# Patient Record
Sex: Male | Born: 1970 | Hispanic: Yes | Marital: Married | Smoking: Never smoker
Health system: Southern US, Community
[De-identification: ages and names within clinical notes are randomized; demographics above are authoritative.]

## PROBLEM LIST (undated history)

## (undated) DIAGNOSIS — F32A Depression, unspecified: Secondary | ICD-10-CM

## (undated) DIAGNOSIS — F329 Major depressive disorder, single episode, unspecified: Secondary | ICD-10-CM

## (undated) HISTORY — DX: Depression, unspecified: F32.A

## (undated) HISTORY — DX: Major depressive disorder, single episode, unspecified: F32.9

---

## 2013-12-05 ENCOUNTER — Ambulatory Visit (INDEPENDENT_AMBULATORY_CARE_PROVIDER_SITE_OTHER): Admitting: Family Medicine

## 2013-12-05 ENCOUNTER — Encounter: Payer: Self-pay | Admitting: Family Medicine

## 2013-12-05 VITALS — BP 126/74 | HR 72 | Temp 97.3°F | Resp 14 | Ht 74.0 in | Wt 216.0 lb

## 2013-12-05 DIAGNOSIS — F3289 Other specified depressive episodes: Secondary | ICD-10-CM

## 2013-12-05 DIAGNOSIS — F32A Depression, unspecified: Secondary | ICD-10-CM

## 2013-12-05 DIAGNOSIS — F329 Major depressive disorder, single episode, unspecified: Secondary | ICD-10-CM

## 2013-12-05 MED ORDER — VENLAFAXINE HCL ER 75 MG PO TB24: 150.0000 mg | ORAL_TABLET | Freq: Every day | ORAL | Status: DC

## 2013-12-05 NOTE — Progress Notes (Signed)
Subjective:    Patient ID: Jack Graves, male    DOB: Mar 16, 1971, 43 y.o.   MRN: [id number]  HPI Patient is a very pleasant 43 year old Hispanic male who presents today to establish care. He is also requesting a referral to psychiatrist to treat depression. He's been in the [employer] for the last 15 years and out of combat. He believes this has created a lot of anxiety in his life. He reports daily anxiety. He describes it as "a feeling of something bad is always about to happen."  He denies any panic attacks. There are no provoking situations that seem to trigger the anxiety. It is a constant state of anxiety. He just had started drinking 2 cans of beer a day to try to help control the anxiety. He also reports depression and anhedonia. He reports difficulty in sleeping because his mind is constant perseverating. He reports decreased energy. He reports poor concentration. He reports excessive feelings of guilt. He is frequently moody and has a very short temper. He is constantly snacking and his wife and children. At the prompting of his right he is seeking medical help. He denies any family history of depression, schizophrenia, bipolar disorder. The patient never had a history of depression in the past. His past medical history and past surgical history are unremarkable. His family history is also unremarkable. His mother and father are both alive and well no medical problems. He also has 2 brothers with no medical problems. Past Medical History  Diagnosis Date  . Depression    No current outpatient prescriptions on file prior to visit.   No current facility-administered medications on file prior to visit.   Allergies  Allergen Reactions  . Penicillins Rash  . Prednisone Rash   History   Social History  . Marital Status: Married    Spouse Name: N/A    Number of Children: N/A  . Years of Education: N/A   Occupational History  . Not on file.   Social History Main Topics  . Smoking  status: Never Smoker   . Smokeless tobacco: Not on file  . Alcohol Use: 0.0 oz/week    1-2 Cans of beer per week  . Drug Use: No  . Sexual Activity: Not on file     Comment: married, military x 15 years   Other Topics Concern  . Not on file   Social History Narrative  . No narrative on file   History reviewed. No pertinent family history.    Review of Systems  All other systems reviewed and are negative.       Objective:   Physical Exam  Vitals reviewed. Constitutional: He is oriented to person, place, and time. He appears well-developed and well-nourished. No distress.  HENT:  Head: Normocephalic and atraumatic.  Right Ear: External ear normal.  Left Ear: External ear normal.  Nose: Nose normal.  Mouth/Throat: Oropharynx is clear and moist. No oropharyngeal exudate.  Eyes: Conjunctivae and EOM are normal. Pupils are equal, round, and reactive to light. No scleral icterus.  Neck: Neck supple. No thyromegaly present.  Cardiovascular: Normal rate, regular rhythm and normal heart sounds.   No murmur heard. Pulmonary/Chest: Effort normal and breath sounds normal. No respiratory distress. He has no wheezes. He has no rales. He exhibits no tenderness.  Abdominal: Soft. Bowel sounds are normal. He exhibits no distension. There is no tenderness. There is no rebound.  Lymphadenopathy:    He has no cervical adenopathy.  Neurological: He is alert  and oriented to person, place, and time. He has normal reflexes. He displays normal reflexes. No cranial nerve deficit. He exhibits normal muscle tone. Coordination normal.  Skin: He is not diaphoretic.  Psychiatric: He has a normal mood and affect. His behavior is normal. Judgment and thought content normal.          Assessment & Plan:  1. Depression I will consult psychiatry. The patient like to see Dr. Donell BeersPlovsky.  In the meantime will start the patient on Effexor XR 75 mg by mouth every morning. After one week he can increase to  150 mg by mouth every morning. Recheck in 4 weeks. I asked him that he be rechecked either by  me or Dr. Rollen SoxPlovlsky.  Together we chose SNRI to try to minimize erectile dysfunction and poor libido. Another option would be Brintellix. - Venlafaxine HCl 75 MG TB24; Take 2 tablets (150 mg total) by mouth daily after breakfast.  Dispense: 30 each; Refill: 3 - Ambulatory referral to Psychiatry

## 2013-12-09 ENCOUNTER — Emergency Department (INDEPENDENT_AMBULATORY_CARE_PROVIDER_SITE_OTHER)

## 2013-12-09 ENCOUNTER — Emergency Department (INDEPENDENT_AMBULATORY_CARE_PROVIDER_SITE_OTHER)
Admission: EM | Admit: 2013-12-09 | Discharge: 2013-12-09 | Disposition: A | Discharge status: Home / Self Care | Source: Home / Self Care | Attending: Family Medicine | Admitting: Family Medicine

## 2013-12-09 ENCOUNTER — Encounter (HOSPITAL_COMMUNITY): Payer: Self-pay | Admitting: Emergency Medicine

## 2013-12-09 DIAGNOSIS — S90129A Contusion of unspecified lesser toe(s) without damage to nail, initial encounter: Secondary | ICD-10-CM

## 2013-12-09 DIAGNOSIS — S90121A Contusion of right lesser toe(s) without damage to nail, initial encounter: Secondary | ICD-10-CM

## 2013-12-09 MED ORDER — IBUPROFEN 800 MG PO TABS: 800.0000 mg | ORAL_TABLET | Freq: Three times a day (TID) | ORAL | Status: DC

## 2013-12-09 NOTE — ED Provider Notes (Signed)
CSN: [id number]     Arrival date & time 12/09/13  1220 History   First MD Initiated Contact with Patient 12/09/13 1503     Chief Complaint  Patient presents with  . Toe Pain   (Consider location/radiation/quality/duration/timing/severity/associated sxs/prior Treatment) Patient is a 43 y.o. male presenting with toe pain. The history is provided by the patient.  Toe Pain This is a new problem. The current episode started 2 days ago (45yo son stepped on toe, continues to be painful.). The problem has been gradually worsening. The symptoms are aggravated by walking.    Past Medical History  Diagnosis Date  . Depression    History reviewed. No pertinent past surgical history. History reviewed. No pertinent family history. History  Substance Use Topics  . Smoking status: Never Smoker   . Smokeless tobacco: Not on file  . Alcohol Use: 0.0 oz/week    1-2 Cans of beer per week    Review of Systems  Musculoskeletal: Positive for gait problem and joint swelling.  Skin: Negative.     Allergies  Penicillins and Prednisone  Home Medications   Current Outpatient Rx  Name  Route  Sig  Dispense  Refill  . ibuprofen (ADVIL,MOTRIN) 800 MG tablet   Oral   Take 1 tablet (800 mg total) by mouth 3 (three) times daily.   21 tablet   0   . Venlafaxine HCl 75 MG TB24   Oral   Take 2 tablets (150 mg total) by mouth daily after breakfast.   30 each   3    BP 132/85  Pulse 64  Temp(Src) 97 F (36.1 C) (Oral)  Resp 16  SpO2 100% Physical Exam  Nursing note and vitals reviewed. Constitutional: He is oriented to person, place, and time. He appears well-developed and well-nourished.  Musculoskeletal: He exhibits tenderness.       Feet:  Neurological: He is alert and oriented to person, place, and time.  Skin: Skin is warm and dry.    ED Course  Procedures (including critical care time) Labs Review Labs Reviewed - No data to display Imaging Review Dg Toe 5th Right  12/09/2013    CLINICAL DATA:  Recent traumatic injury with pain  EXAM: RIGHT FIFTH TOE  COMPARISON:  None.  FINDINGS: There is no evidence of fracture or dislocation. There is no evidence of arthropathy or other focal bone abnormality. Soft tissues are unremarkable.  IMPRESSION: No acute abnormality noted.   Electronically Signed   By: Alcide CleverMark  Lukens M.D.   On: 12/09/2013 15:24      MDM  X-rays reviewed and report per radiologist.     Linna HoffJames D Kindl, MD 12/09/13 [phone]

## 2013-12-09 NOTE — ED Notes (Addendum)
C/o right pinky toe pain due to son kept stepping on toe x2 on Wednesday States toe is swollen and red

## 2013-12-09 NOTE — Discharge Instructions (Signed)
Wear shoe and use ice and prescription as needed, activity as tolerated.

## 2013-12-10 ENCOUNTER — Emergency Department (HOSPITAL_COMMUNITY)
Admission: EM | Admit: 2013-12-10 | Discharge: 2013-12-10 | Disposition: A | Discharge status: Home / Self Care | Attending: Emergency Medicine | Admitting: Emergency Medicine

## 2013-12-10 ENCOUNTER — Encounter (HOSPITAL_COMMUNITY): Payer: Self-pay | Admitting: Emergency Medicine

## 2013-12-10 DIAGNOSIS — F329 Major depressive disorder, single episode, unspecified: Secondary | ICD-10-CM | POA: Insufficient documentation

## 2013-12-10 DIAGNOSIS — S90121A Contusion of right lesser toe(s) without damage to nail, initial encounter: Secondary | ICD-10-CM

## 2013-12-10 DIAGNOSIS — G8911 Acute pain due to trauma: Secondary | ICD-10-CM | POA: Insufficient documentation

## 2013-12-10 DIAGNOSIS — M79609 Pain in unspecified limb: Secondary | ICD-10-CM | POA: Insufficient documentation

## 2013-12-10 DIAGNOSIS — Z880 Allergy status to penicillin: Secondary | ICD-10-CM | POA: Insufficient documentation

## 2013-12-10 DIAGNOSIS — F3289 Other specified depressive episodes: Secondary | ICD-10-CM | POA: Insufficient documentation

## 2013-12-10 DIAGNOSIS — R609 Edema, unspecified: Secondary | ICD-10-CM | POA: Insufficient documentation

## 2013-12-10 DIAGNOSIS — Z79899 Other long term (current) drug therapy: Secondary | ICD-10-CM | POA: Insufficient documentation

## 2013-12-10 DIAGNOSIS — Z791 Long term (current) use of non-steroidal anti-inflammatories (NSAID): Secondary | ICD-10-CM | POA: Insufficient documentation

## 2013-12-10 MED ORDER — HYDROCODONE-ACETAMINOPHEN 5-325 MG PO TABS: 1.0000 | ORAL_TABLET | Freq: Four times a day (QID) | ORAL | Status: DC | PRN

## 2013-12-10 NOTE — Discharge Instructions (Signed)
Do not take the narcotic pain medication if you are driving as it will make you sleepy. You may continue to take the ibuprofen for inflammation. Follow up with Dr. Eulah PontMurphy if symptoms do not improve. Return here as needed.

## 2013-12-10 NOTE — ED Notes (Signed)
Hurt rt. Pinky toe this past Wednesday. Went to [facility] and had an x-ray which was normal. Been taking motrin x 3 day and pain is unbearable and still swelling.

## 2013-12-10 NOTE — ED Provider Notes (Signed)
CSN: [id number]     Arrival date & time 12/10/13  2202 History  This chart was scribed for non-physician practitioner Janne NapoleonHope M Emersyn Wyss, NP working with Darlys Galesavid Masneri, MD by Valera CastleSteven Perry, ED scribe. This patient was seen in room TR05C/TR05C and the patient's care was started at 11:14 PM.   Chief Complaint  Patient presents with  . Foot Pain   (Consider location/radiation/quality/duration/timing/severity/associated sxs/prior Treatment) The history is provided by the patient. No language interpreter was used.   HPI Comments: Jack Graves is a 43 y.o. male who presents to the Emergency Department complaining of intermittent, burning, right pinky toe pain, with associated constant swelling and bruising, onset 4 days ago when his son stepped on his toe twice. He reports having imaging done, with negative results. He reports taking Motrin for his pain, without relief. He reports trouble sleeping due to his pain. He denies having applied ice to his toe, but reports elevating his foot. He denies any other associated symptoms. He reports that he has taken so much Motrin it has caused upset stomach.   Pt arrived to ED with post-op shoe, given to him by Urgent Care a few days ago. Xray done there showed no fracture. Was given Ibuprofen. Pt continues to have pain and swelling.   PCP - Jack Graves,Jack TOM, MD  Past Medical History  Diagnosis Date  . Depression    History reviewed. No pertinent past surgical history. No family history on file. History  Substance Use Topics  . Smoking status: Never Smoker   . Smokeless tobacco: Not on file  . Alcohol Use: 0.0 oz/week    1-2 Cans of beer per week    Review of Systems  Negative except as stated in HPI  Allergies  Penicillins and Prednisone  Home Medications   Current Outpatient Rx  Name  Route  Sig  Dispense  Refill  . ibuprofen (ADVIL,MOTRIN) 800 MG tablet   Oral   Take 1 tablet (800 mg total) by mouth 3 (three) times daily.   21 tablet   0    . meloxicam (MOBIC) 15 MG tablet   Oral   Take 15 mg by mouth once.         . Venlafaxine HCl 75 MG TB24   Oral   Take 2 tablets (150 mg total) by mouth daily after breakfast.   30 each   3    BP 166/107  Pulse 71  Temp(Src) 97.8 F (36.6 C) (Oral)  Resp 18  SpO2 98%  Physical Exam  Nursing note and vitals reviewed. Constitutional: He is oriented to person, place, and time. He appears well-developed and well-nourished. No distress.  HENT:  Head: Normocephalic and atraumatic.  Eyes: EOM are normal.  Neck: Neck supple.  Cardiovascular: Normal rate and intact distal pulses.   Pulmonary/Chest: Effort normal. No respiratory distress.  Musculoskeletal: Normal range of motion. He exhibits edema and tenderness.       Right foot: He exhibits swelling. He exhibits normal capillary refill, no deformity and no laceration.       Feet:  Right 5th metatarsal swollen, ecchymotic, and ttp. Pedal pulse strong, adequate circulation, good touch sensation.  Neurological: He is alert and oriented to person, place, and time. No sensory deficit.  Skin: Skin is warm and dry.  Psychiatric: He has a normal mood and affect. His behavior is normal.    ED Course  Procedures (including critical care time)  DIAGNOSTIC STUDIES: Oxygen Saturation is 98% on room air, normal  by my interpretation.    COORDINATION OF CARE: 11:17 PM-Discussed treatment plan which includes buddy taping his toe and pain medication with pt at bedside and pt agreed to plan.   Labs Review Labs Reviewed - No data to display Imaging Review  Dg Toe 5th Right  12/09/2013   CLINICAL DATA:  Recent traumatic injury with pain  EXAM: RIGHT FIFTH TOE  COMPARISON:  None.  FINDINGS: There is no evidence of fracture or dislocation. There is no evidence of arthropathy or other focal bone abnormality. Soft tissues are unremarkable.  IMPRESSION: No acute abnormality noted.   Electronically Signed   By: Alcide Clever M.D.   On: 12/09/2013  15:24    MDM   1. Contusion of toe of right foot    43 y.o. male with contusion to the right fifth toe 3 days ago with continued swelling and tenderness. Will buddy tape, he will continue to wear the post op shoe, elevate and ice. Will add pain medication and give referral for ortho follow up. I have reviewed this patient's vital signs, nurses notes, appropriate labs and imaging.  I have discussed findings and plan of care with the patient and he voices understanding.    Medication List    TAKE these medications       HYDROcodone-acetaminophen 5-325 MG per tablet  Commonly known as:  NORCO  Take 1 tablet by mouth every 6 (six) hours as needed for moderate pain.      ASK your doctor about these medications       ibuprofen 800 MG tablet  Commonly known as:  ADVIL,MOTRIN  Take 1 tablet (800 mg total) by mouth 3 (three) times daily.     meloxicam 15 MG tablet  Commonly known as:  MOBIC  Take 15 mg by mouth once.     Venlafaxine HCl 75 MG Tb24  Take 2 tablets (150 mg total) by mouth daily after breakfast.      I personally performed the services described in this documentation, which was scribed in my presence. The recorded information has been reviewed and is accurate.         [address] [place], [place] 12/11/13 [phone]

## 2013-12-11 NOTE — ED Provider Notes (Signed)
Medical screening examination/treatment/procedure(s) were performed by non-physician practitioner and as supervising physician I was immediately available for consultation/collaboration.  EKG Interpretation   None         David Masneri, MD 12/11/13 1549 

## 2013-12-13 ENCOUNTER — Encounter: Payer: Self-pay | Admitting: Family Medicine

## 2013-12-13 ENCOUNTER — Ambulatory Visit (INDEPENDENT_AMBULATORY_CARE_PROVIDER_SITE_OTHER): Admitting: Family Medicine

## 2013-12-13 ENCOUNTER — Telehealth: Payer: Self-pay | Admitting: Family Medicine

## 2013-12-13 VITALS — BP 128/80 | HR 72 | Temp 98.8°F | Resp 18 | Ht 74.0 in | Wt 214.0 lb

## 2013-12-13 DIAGNOSIS — S90129A Contusion of unspecified lesser toe(s) without damage to nail, initial encounter: Secondary | ICD-10-CM

## 2013-12-13 DIAGNOSIS — S90121A Contusion of right lesser toe(s) without damage to nail, initial encounter: Secondary | ICD-10-CM | POA: Insufficient documentation

## 2013-12-13 NOTE — Progress Notes (Signed)
Patient ID: Jack Graves, male   DOB: 06/09/1971, 43 y.o.   MRN: [id number]   Subjective:    Patient ID: Jack Graves, male    DOB: 07/12/1971, 43 y.o.   MRN: [id number]  Patient presents for right pinky toe pain  patient presents to followup hematoma right fifth digit. Last Friday his son jumped on his foot twice he has significant pain therefore sought care at the urgent care Center x-ray of the toe was done which did not reveal a fracture. He still had quite significant pain therefore he went to the emergency room the next day he was given a walking shoe and told to followup with his primary care provider and that he had a hematoma.    Review Of Systems: per above  GEN- denies fatigue, fever, weight loss,weakness, recent illness MSK- +joint pain, muscle aches, injury       Objective:    BP 128/80  Pulse 72  Temp(Src) 98.8 F (37.1 C) (Oral)  Resp 18  Ht 6\' 2"  (1.88 m)  Wt 214 lb (97.07 kg)  BMI 27.46 kg/m2 GEN- NAD, alert and oriented x3 Skin- mild erythema and bruising of 5th digit from MIP to DIP MSK- fair ROM of 5th digit due to pain, hematoma at DIP up to nail, +swelling and small blister adjacent, no warmth, TTP, other digits, normal inspection, normal ROM Pulses- DP- 2+  Xray reviewed      Assessment & Plan:      Problem List Items Addressed This Visit   Hematoma of toe of right foot - Primary     Normal x-ray, he still has a hematoma of the fifth digit as well as a small blister I do not see any signs of superinfection. He can continue the pain medication as well as ibuprofen and icing the foot he has a walking shoe I will give him the next few days off for work as he is supposed to wear military combat boots and I do not think he can perform his duties or wear the  specific footwear at this time If it does not improve will refer to podiatry       Note: This dictation was prepared with Dragon dictation along with smaller phrase technology. Any transcriptional  errors that result from this process are unintentional.

## 2013-12-13 NOTE — Assessment & Plan Note (Signed)
Normal x-ray, he still has a hematoma of the fifth digit as well as a small blister I do not see any signs of superinfection. 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

## 2013-12-13 NOTE — Telephone Encounter (Signed)
Pt has decided that he would like a referral to the orthopedics Call back number is [phone]

## 2013-12-13 NOTE — Patient Instructions (Signed)
Call if not improved and I will refer you to orthopedics  Hematoma A hematoma is a collection of blood under the skin, in an organ, in a body space, in a joint space, or in other tissue. The blood can clot to form a lump that you can see and feel. The lump is often firm and may sometimes become sore and tender. Most hematomas get better in a few days to weeks. However, some hematomas may be serious and require medical care. Hematomas can range in size from very small to very large. CAUSES  A hematoma can be caused by a blunt or penetrating injury. It can also be caused by spontaneous leakage from a blood vessel under the skin. Spontaneous leakage from a blood vessel is more likely to occur in older people, especially those taking blood thinners. Sometimes, a hematoma can develop after certain medical procedures. SIGNS AND SYMPTOMS   A firm lump on the body.  Possible pain and tenderness in the area.  Bruising.Blue, dark blue, purple-red, or yellowish skin may appear at the site of the hematoma if the hematoma is close to the surface of the skin. For hematomas in deeper tissues or body spaces, the signs and symptoms may be subtle. For example, an intra-abdominal hematoma may cause abdominal pain, weakness, fainting, and shortness of breath. An intracranial hematoma may cause a headache or symptoms such as weakness, trouble speaking, or a change in consciousness. DIAGNOSIS  A hematoma can usually be diagnosed based on your medical history and a physical exam. Imaging tests may be needed if your health care provider suspects a hematoma in deeper tissues or body spaces, such as the abdomen, head, or chest. These tests may include ultrasonography or a CT scan.  TREATMENT  Hematomas usually go away on their own over time. Rarely does the blood need to be drained out of the body. Large hematomas or those that may affect vital organs will sometimes need surgical drainage or monitoring. HOME CARE  INSTRUCTIONS   Apply ice to the injured area:   Put ice in a plastic bag.   Place a towel between your skin and the bag.   Leave the ice on for 20 minutes, 2 3 times a day for the first 1 to 2 days.   After the first 2 days, switch to using warm compresses on the hematoma.   Elevate the injured area to help decrease pain and swelling. Wrapping the area with an elastic bandage may also be helpful. Compression helps to reduce swelling and promotes shrinking of the hematoma. Make sure the bandage is not wrapped too tight.   If your hematoma is on a lower extremity and is painful, crutches may be helpful for a couple days.   Only take over-the-counter or prescription medicines as directed by your health care provider. SEEK IMMEDIATE MEDICAL CARE IF:   You have increasing pain, or your pain is not controlled with medicine.   You have a fever.   You have worsening swelling or discoloration.   Your skin over the hematoma breaks or starts bleeding.   Your hematoma is in your chest or abdomen and you have weakness, shortness of breath, or a change in consciousness.  Your hematoma is on your scalp (caused by a fall or injury) and you have a worsening headache or a change in alertness or consciousness. MAKE SURE YOU:   Understand these instructions.  Will watch your condition.  Will get help right away if you are not doing  well or get worse. Document Released: 06/03/2004 Document Revised: 06/22/2013 Document Reviewed: 03/30/2013 [facility] Patient Information 2014 [place], [place].

## 2013-12-14 NOTE — Addendum Note (Signed)
Addended by: Milinda AntisURHAM, Kailynn Satterly F on: 12/14/2013 11:18 PM   Modules accepted: Orders

## 2013-12-14 NOTE — Telephone Encounter (Signed)
Urgent podiatry referral placed.

## 2013-12-15 ENCOUNTER — Ambulatory Visit: Admitting: Family Medicine

## 2014-05-02 ENCOUNTER — Ambulatory Visit (INDEPENDENT_AMBULATORY_CARE_PROVIDER_SITE_OTHER): Admitting: Family Medicine

## 2014-05-02 ENCOUNTER — Encounter: Payer: Self-pay | Admitting: Family Medicine

## 2014-05-02 VITALS — BP 110/70 | HR 60 | Temp 97.1°F | Resp 12 | Ht 74.0 in | Wt 216.0 lb

## 2014-05-02 DIAGNOSIS — B009 Herpesviral infection, unspecified: Secondary | ICD-10-CM

## 2014-05-02 MED ORDER — MOMETASONE FUROATE 0.1 % EX OINT
TOPICAL_OINTMENT | Freq: Every day | CUTANEOUS | Status: DC
Start: 2014-05-02 — End: 2014-08-18

## 2014-05-02 MED ORDER — VALACYCLOVIR HCL 500 MG PO TABS: 500.0000 mg | ORAL_TABLET | Freq: Two times a day (BID) | ORAL | Status: DC

## 2014-05-02 NOTE — Progress Notes (Signed)
   Subjective:    Patient ID: Jack Graves, male    DOB: 07/31/1971, 43 y.o.   MRN: [id number]  HPI  Patient has a burning erythematous papular rash just above his right clavicle. It is an erythematous patch with scattered papules in a linear grouping. Patient had a third to 3 days. He states it itches and burns. Interestingly, the patient reports that he gets the exact same rash, in the exact same spot, every year at the exact same time. He denies any recent allergic exposures. He has not [place] working [place] [employer] [place] or dealing with brush/weeds.  He denies any new creams.  The rash initially burned but now it itches. Past Medical History  Diagnosis Date  . Depression    No current outpatient prescriptions on file prior to visit.   No current facility-administered medications on file prior to visit.   Allergies  Allergen Reactions  . Penicillins Rash  . Prednisone Rash   History   Social History  . Marital Status: Married    Spouse Name: N/A    Number of Children: N/A  . Years of Education: N/A   Occupational History  . Not on file.   Social History Main Topics  . Smoking status: Never Smoker   . Smokeless tobacco: Not on file  . Alcohol Use: 0.0 oz/week    1-2 Cans of beer per week  . Drug Use: No  . Sexual Activity: Not on file     Comment: married, military x 15 years   Other Topics Concern  . Not on file   Social History Narrative  . No narrative on file     Review of Systems  All other systems reviewed and are negative.      Objective:   Physical Exam  Vitals reviewed. Cardiovascular: Normal rate, regular rhythm and normal heart sounds.   Pulmonary/Chest: Effort normal and breath sounds normal. No respiratory distress. He has no wheezes. He has no rales.  Skin: Rash noted. There is erythema.   see the description of the rash in the  history of present illness        Assessment & Plan:  1. HSV infection Given the fact he is the exact same rash in  the exact same area for several years, I believe this is a herpetiform rash. Unfortunately there no vesicles which I can culture.  I will check a serum blood test to evaluate for possible HSV exposure. Treat the patient empirically with Valtrex 500 mg by mouth twice a day for 3 days. He can use Elocon ointment as needed for itching or burning. - valACYclovir (VALTREX) 500 MG tablet; Take 1 tablet (500 mg total) by mouth 2 (two) times daily.  Dispense: 6 tablet; Refill: 0 - mometasone (ELOCON) 0.1 % ointment; Apply topically daily.  Dispense: 45 g; Refill: 0 - HSV(herpes smplx)abs-1+2(IgG+IgM)-bld

## 2014-05-03 LAB — HSV(HERPES SMPLX)ABS-I+II(IGG+IGM)-BLD
HERPES SIMPLEX VRS I-IGM AB (EIA): 0.54 {index}
HSV 1 Glycoprotein G Ab, IgG: 1.8 IV — ABNORMAL HIGH
HSV 2 GLYCOPROTEIN G AB, IGG: 13.57 IV — AB

## 2014-08-12 ENCOUNTER — Emergency Department: Payer: Self-pay | Admitting: Emergency Medicine

## 2014-08-13 ENCOUNTER — Emergency Department: Payer: Self-pay | Admitting: Emergency Medicine

## 2014-08-14 ENCOUNTER — Telehealth: Payer: Self-pay | Admitting: Family Medicine

## 2014-08-14 DIAGNOSIS — S161XXA Strain of muscle, fascia and tendon at neck level, initial encounter: Secondary | ICD-10-CM

## 2014-08-14 NOTE — Telephone Encounter (Signed)
Pt wants a referral to see [facility], he called ortho office to schedule appt and needs a [employer] referral from us PCP, I called [facility] to see if I can get Medical Records for when pt was seen, was informed need to fax over to [phone] to receive Medical record,I [facility] so that I can get information has to why needing to go have ortho done.

## 2014-08-14 NOTE — Telephone Encounter (Signed)
Patient is calling saying that he already has appointment with an ortho doc but needs a referral for his [employer] insurance if possible  Please call him back at [phone]

## 2014-08-15 NOTE — Telephone Encounter (Signed)
Referral placed for orthopedics to Dr. Anderson MaltaBrooks [facility], submitted [employer] referral, pending approval

## 2014-08-17 ENCOUNTER — Encounter (HOSPITAL_COMMUNITY): Payer: Self-pay | Admitting: Emergency Medicine

## 2014-08-17 ENCOUNTER — Emergency Department: Payer: Self-pay | Admitting: Emergency Medicine

## 2014-08-17 DIAGNOSIS — Z8659 Personal history of other mental and behavioral disorders: Secondary | ICD-10-CM | POA: Insufficient documentation

## 2014-08-17 DIAGNOSIS — Z79899 Other long term (current) drug therapy: Secondary | ICD-10-CM | POA: Insufficient documentation

## 2014-08-17 DIAGNOSIS — Z880 Allergy status to penicillin: Secondary | ICD-10-CM | POA: Insufficient documentation

## 2014-08-17 DIAGNOSIS — M542 Cervicalgia: Secondary | ICD-10-CM | POA: Diagnosis not present

## 2014-08-17 DIAGNOSIS — Z7952 Long term (current) use of systemic steroids: Secondary | ICD-10-CM | POA: Diagnosis not present

## 2014-08-17 NOTE — ED Notes (Signed)
Pt. reports persistent right side neck pain onset Friday last week , seen at [facility] twice received pain injections /CT scan done and referral to orthopedic MD , seen by Ortho MD today advised to follow up with PT , no relief from prescription pain medications . Denies fever or neck stiffness

## 2014-08-18 ENCOUNTER — Emergency Department (HOSPITAL_COMMUNITY)
Admission: EM | Admit: 2014-08-18 | Discharge: 2014-08-18 | Disposition: A | Discharge status: Home / Self Care | Attending: Emergency Medicine | Admitting: Emergency Medicine

## 2014-08-18 ENCOUNTER — Ambulatory Visit (INDEPENDENT_AMBULATORY_CARE_PROVIDER_SITE_OTHER): Admitting: Family Medicine

## 2014-08-18 ENCOUNTER — Encounter: Payer: Self-pay | Admitting: Family Medicine

## 2014-08-18 VITALS — BP 126/74 | HR 64 | Temp 98.4°F | Resp 14 | Ht 74.0 in | Wt 216.0 lb

## 2014-08-18 DIAGNOSIS — B085 Enteroviral vesicular pharyngitis: Secondary | ICD-10-CM

## 2014-08-18 DIAGNOSIS — M542 Cervicalgia: Secondary | ICD-10-CM

## 2014-08-18 MED ORDER — VALACYCLOVIR HCL 1 G PO TABS: 2000.0000 mg | ORAL_TABLET | Freq: Two times a day (BID) | ORAL | Status: DC

## 2014-08-18 MED ORDER — METHOCARBAMOL 500 MG PO TABS: 500.0000 mg | ORAL_TABLET | Freq: Two times a day (BID) | ORAL | Status: DC

## 2014-08-18 MED ORDER — KETOROLAC TROMETHAMINE 30 MG/ML IJ SOLN
30.0000 mg | Freq: Once | INTRAMUSCULAR | Status: AC
  Administered 2014-08-18: 30 mg via INTRAMUSCULAR
  Filled 2014-08-18: qty 1

## 2014-08-18 MED ORDER — TRAMADOL HCL 50 MG PO TABS: 50.0000 mg | ORAL_TABLET | Freq: Four times a day (QID) | ORAL | Status: DC | PRN

## 2014-08-18 NOTE — ED Provider Notes (Signed)
CSN: [id number]     Arrival date & time 08/17/14  2344 History   First MD Initiated Contact with Patient 08/18/14 0028     Chief Complaint  Patient presents with  . Neck Pain     (Consider location/radiation/quality/duration/timing/severity/associated sxs/prior Treatment) HPI Comments: Patient presents today with pain of the cervical spine.  Pain has been present for the past six days and is gradually worsening.  He denies acute injury or trauma.  He reports that he was seen in the ED at [facility] five days ago and had a CT scan of the cervical spine.  He brings in the copy of the CT report.  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  It also shows moderate left C3-4 and moderate to severe right C6-7 neural foraminal narrowing.  He reports that he was then referred to Dr. Shon BatonBrooks.  He saw Dr. Shon BatonBrooks in the office today.  He reports that Dr. Shon BatonBrooks then referred him to Dr. Ethelene Halamos to receive injections and also was referred to PT.  He denies fever, chills, new numbness or tingling, or weakness.  He has been taking Robaxin, Norco, and Ketorolac for the pain, which has helped.  However, he is almost out of the medication.    The history is provided by the patient.    Past Medical History  Diagnosis Date  . Depression    History reviewed. No pertinent past surgical history. No family history on file. History  Substance Use Topics  . Smoking status: Never Smoker   . Smokeless tobacco: Not on file  . Alcohol Use: 0.0 oz/week    1-2 Cans of beer per week    Review of Systems  Musculoskeletal: Positive for neck pain.  All other systems reviewed and are negative.     Allergies  Penicillins and Prednisone  Home Medications   Prior to Admission medications   Medication Sig Start Date End Date Taking? Authorizing Provider  mometasone (ELOCON) 0.1 % ointment Apply topically daily. 05/02/14   Donita BrooksWarren T Pickard, MD  valACYclovir (VALTREX) 500 MG tablet Take 1 tablet (500 mg total) by  mouth 2 (two) times daily. 05/02/14   Donita BrooksWarren T Pickard, MD   BP 148/87  Pulse 67  Temp(Src) 98.1 F (36.7 C)  Resp 18  Wt 216 lb 6 oz (98.147 kg)  SpO2 100% Physical Exam  Nursing note and vitals reviewed. Constitutional: He appears well-developed and well-nourished.  HENT:  Head: Normocephalic and atraumatic.  Cardiovascular: Normal rate, regular rhythm and normal heart sounds.   Pulmonary/Chest: Effort normal and breath sounds normal.  Musculoskeletal: Normal range of motion.       Cervical back: He exhibits tenderness and bony tenderness. He exhibits normal range of motion, no swelling, no edema and no deformity.       Thoracic back: He exhibits normal range of motion, no tenderness, no bony tenderness, no swelling, no edema and no deformity.       Lumbar back: He exhibits normal range of motion, no tenderness, no bony tenderness, no swelling, no edema and no deformity.  Full ROM of UE bilaterally  Neurological: He is alert. No sensory deficit.  Distal sensation of both hands intact Grip strength 5/5 bilaterally  Skin: Skin is warm and dry.  Psychiatric: He has a normal mood and affect.    ED Course  Procedures (including critical care time) Labs Review Labs Reviewed - No data to display  Imaging Review No results found.   EKG Interpretation None  MDM   Final diagnoses:  None   Patient presenting with neck pain x 6 days, which is gradually worsening.  No neurological deficits.  Patient had recent imaging of the cervical spine showing DDD and also neural foraminal narrowing.  Patient is afebrile.  He was seen by Dr. Shon BatonBrooks today and has been referred to Dr. Ethelene Halamos to receive injections.  Patient given IM Toradol in the ED.  Patient stable for discharge.  Return precautions given.      Santiago GladHeather Meila Berke, PA-C 08/18/14 [phone]

## 2014-08-18 NOTE — Progress Notes (Signed)
Subjective:    Patient ID: Jack Graves, male    DOB: 07/10/1971, 43 y.o.   MRN: [id number]  HPI  Last week, the patient developed severe right-sided neck pain. He was seen multiple times at the emergency room where CT scan of the neck was negative. He is also seeing a back surgeon who performed x-rays which were negative. He was told that he has a muscle strain in his neck. However over the last 2-3 days, he has developed numerous vesicular ulcers in his mouth. There are 2 yellow-based ulcers on his tongue which are approximately 5-6 mm in diameter. He has 2 similar ulcers on his hard palate and one in the right posterior oropharynx which I believe accounts for his right ear pain. On examination of his right unit, the tympanic membrane appears completely normal. The patient has tender lymphadenopathy in the right anterior cervical chain. He is also complaining of neck stiffness and headache. He has no evidence of encephalitis. He has range of motion without pain in his neck today and there is no evidence of meningitis in the patient has no rash on his palms or on his feet. He has no known exposure to hand foot and mouth disease. Past Medical History  Diagnosis Date  . Depression    No past surgical history on file. Current Outpatient Prescriptions on File Prior to Visit  Medication Sig Dispense Refill  . traMADol (ULTRAM) 50 MG tablet Take 1 tablet (50 mg total) by mouth every 6 (six) hours as needed.  15 tablet  0  . valACYclovir (VALTREX) 500 MG tablet Take 1 tablet (500 mg total) by mouth 2 (two) times daily.  6 tablet  0   No current facility-administered medications on file prior to visit.   Allergies  Allergen Reactions  . Penicillins Rash  . Prednisone Rash   History   Social History  . Marital Status: Married    Spouse Name: N/A    Number of Children: N/A  . Years of Education: N/A   Occupational History  . Not on file.   Social History Main Topics  . Smoking  status: Never Smoker   . Smokeless tobacco: Not on file  . Alcohol Use: 0.0 oz/week    1-2 Cans of beer per week  . Drug Use: No  . Sexual Activity: Not on file     Comment: married, military x 15 years   Other Topics Concern  . Not on file   Social History Narrative  . No narrative on file     Review of Systems  All other systems reviewed and are negative.      Objective:   Physical Exam  Vitals reviewed. Constitutional: He is oriented to person, place, and time. He appears well-developed and well-nourished.  HENT:  Right Ear: Tympanic membrane and ear canal normal.  Left Ear: Tympanic membrane and ear canal normal.  Nose: Nose normal.  Mouth/Throat: Oral lesions present. Posterior oropharyngeal erythema present. No oropharyngeal exudate or tonsillar abscesses.  Cardiovascular: Normal rate and regular rhythm.   Pulmonary/Chest: Effort normal and breath sounds normal.  Lymphadenopathy:    He has cervical adenopathy.  Neurological: He is alert and oriented to person, place, and time. He has normal reflexes. He displays normal reflexes. No cranial nerve deficit. He exhibits normal muscle tone. Coordination normal.          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 This explains his right ear pain. Also explains the pain in his mouth. It may even account for the muscle soreness that he is experiencing on the right side of his neck.  At the present time there is no evidence of meningitis or encephalitis. Patient states he gets similar outbreaks of these aphthous ulcers in his mouth quite frequently however this is the most severe outbreak he has ever had. I will treat the patient with about 6 to milligrams by mouth twice a day for one day. I also gave the patient a prescription for [facility] magic mouthwash 1 teaspoon every 6 hours as  needed for a sore throat.  Recheck Monday or sooner if worse.

## 2014-08-22 NOTE — ED Provider Notes (Signed)
Medical screening examination/treatment/procedure(s) were performed by non-physician practitioner and as supervising physician I was immediately available for consultation/collaboration.   EKG Interpretation None      Mahmoud Blazejewski, MD, FACEP   Brizeida Mcmurry L Omarrion Carmer, MD 08/22/14 1607 

## 2014-08-24 ENCOUNTER — Inpatient Hospital Stay (HOSPITAL_COMMUNITY)
Admission: EM | Admit: 2014-08-24 | Discharge: 2014-08-29 | DRG: 092 | Disposition: A | Discharge status: Home / Self Care | Attending: Internal Medicine | Admitting: Internal Medicine

## 2014-08-24 ENCOUNTER — Encounter: Payer: Self-pay | Admitting: Family Medicine

## 2014-08-24 ENCOUNTER — Emergency Department (HOSPITAL_COMMUNITY)

## 2014-08-24 ENCOUNTER — Ambulatory Visit (INDEPENDENT_AMBULATORY_CARE_PROVIDER_SITE_OTHER): Admitting: Family Medicine

## 2014-08-24 ENCOUNTER — Encounter (HOSPITAL_COMMUNITY): Payer: Self-pay | Admitting: Emergency Medicine

## 2014-08-24 VITALS — BP 110/74 | HR 74 | Temp 98.5°F | Resp 14 | Ht 74.0 in | Wt 215.0 lb

## 2014-08-24 DIAGNOSIS — Z79899 Other long term (current) drug therapy: Secondary | ICD-10-CM | POA: Diagnosis not present

## 2014-08-24 DIAGNOSIS — R21 Rash and other nonspecific skin eruption: Secondary | ICD-10-CM | POA: Diagnosis present

## 2014-08-24 DIAGNOSIS — F329 Major depressive disorder, single episode, unspecified: Secondary | ICD-10-CM | POA: Diagnosis present

## 2014-08-24 DIAGNOSIS — R51 Headache: Secondary | ICD-10-CM

## 2014-08-24 DIAGNOSIS — Z880 Allergy status to penicillin: Secondary | ICD-10-CM | POA: Diagnosis not present

## 2014-08-24 DIAGNOSIS — M542 Cervicalgia: Secondary | ICD-10-CM

## 2014-08-24 DIAGNOSIS — Z8619 Personal history of other infectious and parasitic diseases: Secondary | ICD-10-CM | POA: Diagnosis not present

## 2014-08-24 DIAGNOSIS — G44001 Cluster headache syndrome, unspecified, intractable: Secondary | ICD-10-CM

## 2014-08-24 DIAGNOSIS — H70001 Acute mastoiditis without complications, right ear: Secondary | ICD-10-CM | POA: Diagnosis present

## 2014-08-24 DIAGNOSIS — M436 Torticollis: Secondary | ICD-10-CM

## 2014-08-24 DIAGNOSIS — G08 Intracranial and intraspinal phlebitis and thrombophlebitis: Principal | ICD-10-CM

## 2014-08-24 DIAGNOSIS — R519 Headache, unspecified: Secondary | ICD-10-CM | POA: Diagnosis present

## 2014-08-24 DIAGNOSIS — I809 Phlebitis and thrombophlebitis of unspecified site: Secondary | ICD-10-CM | POA: Diagnosis present

## 2014-08-24 DIAGNOSIS — R291 Meningismus: Secondary | ICD-10-CM

## 2014-08-24 DIAGNOSIS — G4459 Other complicated headache syndrome: Secondary | ICD-10-CM

## 2014-08-24 DIAGNOSIS — R509 Fever, unspecified: Secondary | ICD-10-CM

## 2014-08-24 DIAGNOSIS — H70009 Acute mastoiditis without complications, unspecified ear: Secondary | ICD-10-CM | POA: Diagnosis present

## 2014-08-24 LAB — COMPREHENSIVE METABOLIC PANEL
ALBUMIN: 3.5 g/dL (ref 3.5–5.2)
ALK PHOS: 62 U/L (ref 39–117)
ALT: 18 U/L (ref 0–53)
AST: 15 U/L (ref 0–37)
Anion gap: 14 (ref 5–15)
BILIRUBIN TOTAL: 0.9 mg/dL (ref 0.3–1.2)
BUN: 12 mg/dL (ref 6–23)
CHLORIDE: 100 meq/L (ref 96–112)
CO2: 26 meq/L (ref 19–32)
Calcium: 9.1 mg/dL (ref 8.4–10.5)
Creatinine, Ser: 1.16 mg/dL (ref 0.50–1.35)
GFR calc Af Amer: 86 mL/min — ABNORMAL LOW (ref >90)
GFR, EST NON AFRICAN AMERICAN: 75 mL/min — AB (ref >90)
Glucose, Bld: 104 mg/dL — ABNORMAL HIGH (ref 70–99)
POTASSIUM: 3.5 meq/L — AB (ref 3.7–5.3)
SODIUM: 140 meq/L (ref 137–147)
Total Protein: 7.7 g/dL (ref 6.0–8.3)

## 2014-08-24 LAB — CBC WITH DIFFERENTIAL/PLATELET
BASOS ABS: 0 10*3/uL (ref 0.0–0.1)
BASOS PCT: 0 % (ref 0–1)
Eosinophils Absolute: 0.2 10*3/uL (ref 0.0–0.7)
Eosinophils Relative: 2 % (ref 0–5)
HEMATOCRIT: 41.9 % (ref 39.0–52.0)
Hemoglobin: 15.1 g/dL (ref 13.0–17.0)
LYMPHS PCT: 16 % (ref 12–46)
Lymphs Abs: 1.8 10*3/uL (ref 0.7–4.0)
MCH: 31 pg (ref 26.0–34.0)
MCHC: 36 g/dL (ref 30.0–36.0)
MCV: 86 fL (ref 78.0–100.0)
MONO ABS: 1 10*3/uL (ref 0.1–1.0)
Monocytes Relative: 9 % (ref 3–12)
NEUTROS ABS: 8.3 10*3/uL — AB (ref 1.7–7.7)
NEUTROS PCT: 73 % (ref 43–77)
PLATELETS: 325 10*3/uL (ref 150–400)
RBC: 4.87 MIL/uL (ref 4.22–5.81)
RDW: 11.8 % (ref 11.5–15.5)
WBC: 11.3 10*3/uL — AB (ref 4.0–10.5)

## 2014-08-24 LAB — LACTIC ACID, PLASMA: Lactic Acid, Venous: 1 mmol/L (ref 0.5–2.2)

## 2014-08-24 LAB — PROTIME-INR
INR: 1.21 (ref 0.00–1.49)
PROTHROMBIN TIME: 15.4 s — AB (ref 11.6–15.2)

## 2014-08-24 LAB — I-STAT CG4 LACTIC ACID, ED: Lactic Acid, Venous: 1 mmol/L (ref 0.5–2.2)

## 2014-08-24 MED ORDER — DEXTROSE 5 % IV SOLN
10.0000 mg/kg | Freq: Once | INTRAVENOUS | Status: AC
  Administered 2014-08-25: 820 mg via INTRAVENOUS
  Filled 2014-08-24: qty 16.4

## 2014-08-24 MED ORDER — DEXTROSE 5 % IV SOLN
2.0000 g | Freq: Once | INTRAVENOUS | Status: AC
  Administered 2014-08-25: 2 g via INTRAVENOUS
  Filled 2014-08-24: qty 2

## 2014-08-24 MED ORDER — SODIUM CHLORIDE 0.9 % IV BOLUS (SEPSIS)
1000.0000 mL | Freq: Once | INTRAVENOUS | Status: AC
  Administered 2014-08-24: 1000 mL via INTRAVENOUS

## 2014-08-24 MED ORDER — VANCOMYCIN HCL 10 G IV SOLR
2000.0000 mg | Freq: Once | INTRAVENOUS | Status: AC
  Administered 2014-08-25: 2000 mg via INTRAVENOUS
  Filled 2014-08-24: qty 2000

## 2014-08-24 MED ORDER — LIDOCAINE HCL (PF) 1 % IJ SOLN
INTRAMUSCULAR | Status: AC
  Filled 2014-08-24: qty 10

## 2014-08-24 MED ORDER — DIAZEPAM 10 MG PO TABS: 10.0000 mg | ORAL_TABLET | Freq: Three times a day (TID) | ORAL | Status: DC | PRN

## 2014-08-24 NOTE — ED Notes (Signed)
Patient presents moaning out loud with pain in his headl  Has been taking Valium for his neck

## 2014-08-24 NOTE — ED Provider Notes (Signed)
CSN: [id number]     Arrival date & time 08/24/14  2034 History   First MD Initiated Contact with Patient 08/24/14 2108     Chief Complaint  Patient presents with  . Headache    Patient is a 43 y.o. male presenting with headaches. The history is provided by the patient.  Headache Pain location:  Frontal Quality:  Stabbing Radiates to:  R neck Severity currently:  10/10 Severity at highest:  10/10 Onset quality:  Sudden Duration:  2 weeks Timing:  Constant Progression:  Unchanged Chronicity:  New Similar to prior headaches: no   Context comment:  Recent dental procedure; hx of HSV Relieved by:  Nothing Worsened by:  Neck movement Ineffective treatments:  NSAIDs, acetaminophen and prescription medications (Valium, Oxycodone) Associated symptoms: ear pain (right) and fever   Associated symptoms: no cough, no diarrhea, no nausea, no numbness, no photophobia, no URI, no visual change, no vomiting and no weakness   Ear pain:    Location:  Right   Severity:  Severe   Onset quality:  Sudden   Duration:  2 weeks   Timing:  Constant   Progression:  Unchanged   Chronicity:  New Fever:    Duration:  1 day   Timing:  Constant   Temp source:  Subjective Risk factors: lifestyle not sedentary     Past Medical History  Diagnosis Date  . Depression    History reviewed. No pertinent past surgical history. No family history on file. History  Substance Use Topics  . Smoking status: Never Smoker   . Smokeless tobacco: Not on file  . Alcohol Use: 0.0 oz/week    1-2 Cans of beer per week    Review of Systems  Constitutional: Positive for fever, chills and appetite change (decreased).  HENT: Positive for ear pain (right).        Oral lesions  Eyes: Negative for photophobia and visual disturbance.  Respiratory: Negative for cough and shortness of breath.   Cardiovascular: Negative for chest pain.  Gastrointestinal: Negative for nausea, vomiting and diarrhea.  Skin: Positive for  rash.  Neurological: Positive for headaches. Negative for speech difficulty, weakness and numbness.  All other systems reviewed and are negative.     Allergies  Penicillins and Prednisone  Home Medications   Prior to Admission medications   Medication Sig Start Date End Date Taking? Authorizing Provider  diazepam (VALIUM) 10 MG tablet Take 1 tablet (10 mg total) by mouth every 8 (eight) hours as needed (muscle pain). 08/24/14   Donita BrooksWarren T Pickard, MD  methocarbamol (ROBAXIN) 500 MG tablet Take 500 mg by mouth 2 (two) times daily.    Historical Provider, MD  traMADol (ULTRAM) 50 MG tablet Take 1 tablet (50 mg total) by mouth every 6 (six) hours as needed. 08/18/14   Heather Laisure, PA-C  valACYclovir (VALTREX) 1000 MG tablet Take 2 tablets (2,000 mg total) by mouth 2 (two) times daily. 08/18/14   Donita BrooksWarren T Pickard, MD  valACYclovir (VALTREX) 500 MG tablet Take 1 tablet (500 mg total) by mouth 2 (two) times daily. 05/02/14   Donita BrooksWarren T Pickard, MD   BP 136/82  Pulse 99  Temp(Src) 100.6 F (38.1 C) (Oral)  Resp 20  Ht 6\' 2"  (1.88 m)  Wt 210 lb (95.255 kg)  BMI 26.95 kg/m2  SpO2 100%  Physical Exam  Constitutional: He is oriented to person, place, and time. He appears well-developed and well-nourished. He appears distressed (appears uncomfortable).  HENT:  Head: Normocephalic and  atraumatic.  Right Ear: Tympanic membrane and external ear normal.  Left Ear: Tympanic membrane and external ear normal.  No sublingual or submandibular fullness or tenderness; ulcerated lesions on tongue  Eyes: Pupils are equal, round, and reactive to light.  Neck:  Neck stiffness present; will not turn head right/left even with distraction  Cardiovascular: Regular rhythm.  Tachycardia present.   Pulmonary/Chest: Effort normal and breath sounds normal. No respiratory distress. He has no decreased breath sounds. He has no wheezes.  Abdominal: Soft. Normal appearance. He exhibits no distension. There is no  tenderness.  Neurological: He is alert and oriented to person, place, and time. He displays no tremor. No sensory deficit. Coordination normal. GCS eye subscore is 4. GCS verbal subscore is 5. GCS motor subscore is 6.  Normal strength and sensation in all extremities; CN 3-12 intact; Normal finger to nose bilaterally  Skin: Skin is warm. Rash (pustules over torso, face, legs) noted. Rash is pustular (diffuse). He is diaphoretic.  Psychiatric: His mood appears anxious.    ED Course  Procedures (including critical care time)  Labs Review  Results for orders placed during the hospital encounter of 08/24/14  CBC WITH DIFFERENTIAL      Result Value Ref Range   WBC 11.3 (*) 4.0 - 10.5 K/uL   RBC 4.87  4.22 - 5.81 MIL/uL   Hemoglobin 15.1  13.0 - 17.0 g/dL   HCT 14.741.9  82.939.0 - 56.252.0 %   MCV 86.0  78.0 - 100.0 fL   MCH 31.0  26.0 - 34.0 pg   MCHC 36.0  30.0 - 36.0 g/dL   RDW 13.011.8  86.511.5 - 78.415.5 %   Platelets 325  150 - 400 K/uL   Neutrophils Relative % 73  43 - 77 %   Neutro Abs 8.3 (*) 1.7 - 7.7 K/uL   Lymphocytes Relative 16  12 - 46 %   Lymphs Abs 1.8  0.7 - 4.0 K/uL   Monocytes Relative 9  3 - 12 %   Monocytes Absolute 1.0  0.1 - 1.0 K/uL   Eosinophils Relative 2  0 - 5 %   Eosinophils Absolute 0.2  0.0 - 0.7 K/uL   Basophils Relative 0  0 - 1 %   Basophils Absolute 0.0  0.0 - 0.1 K/uL  COMPREHENSIVE METABOLIC PANEL      Result Value Ref Range   Sodium 140  137 - 147 mEq/L   Potassium 3.5 (*) 3.7 - 5.3 mEq/L   Chloride 100  96 - 112 mEq/L   CO2 26  19 - 32 mEq/L   Glucose, Bld 104 (*) 70 - 99 mg/dL   BUN 12  6 - 23 mg/dL   Creatinine, Ser 6.961.16  0.50 - 1.35 mg/dL   Calcium 9.1  8.4 - 29.510.5 mg/dL   Total Protein 7.7  6.0 - 8.3 g/dL   Albumin 3.5  3.5 - 5.2 g/dL   AST 15  0 - 37 U/L   ALT 18  0 - 53 U/L   Alkaline Phosphatase 62  39 - 117 U/L   Total Bilirubin 0.9  0.3 - 1.2 mg/dL   GFR calc non Af Amer 75 (*) >90 mL/min   GFR calc Af Amer 86 (*) >90 mL/min   Anion gap 14  5 -  15  LACTIC ACID, PLASMA      Result Value Ref Range   Lactic Acid, Venous 1.0  0.5 - 2.2 mmol/L  PROTIME-INR  Result Value Ref Range   Prothrombin Time 15.4 (*) 11.6 - 15.2 seconds   INR 1.21  0.00 - 1.49  I-STAT CG4 LACTIC ACID, ED      Result Value Ref Range   Lactic Acid, Venous 1.00  0.5 - 2.2 mmol/L    Imaging Review Ct Head Wo Contrast  08/24/2014   CLINICAL DATA:  Severe right side headache  EXAM: CT HEAD WITHOUT CONTRAST  TECHNIQUE: Contiguous axial images were obtained from the base of the skull through the vertex without intravenous contrast.  COMPARISON:  None  FINDINGS: No skull fracture is noted. Paranasal sinuses and mastoid air cells are unremarkable. No intracranial hemorrhage, mass effect or midline shift. No acute cortical infarction. No mass lesion is noted on this unenhanced scan. The gray and white-matter differentiation is preserved.  IMPRESSION: No acute intracranial abnormality.   Electronically Signed   By: Natasha Mead M.D.   On: 08/24/2014 22:58   Dg Chest Port 1 View  08/24/2014   CLINICAL DATA:  43 year old male with acute fever, right side head and neck pain. Initial encounter.  EXAM: PORTABLE CHEST - 1 VIEW  COMPARISON:  None.  FINDINGS: Portable AP semi upright view at at 2159 hrs. Mildly low lung volumes. Normal cardiac size and mediastinal contours. No pneumothorax, pulmonary edema, pleural effusion or confluent pulmonary opacity. No osseous abnormality identified.  IMPRESSION: Somewhat low lung volumes, otherwise no acute cardiopulmonary abnormality.   Electronically Signed   By: Augusto Gamble M.D.   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 Presents with fever, diffuse rash, severe right head pain, right neck pain, tongue ulcers. Has been taking a large amount of ibuprofen (80 tablets in 2 days about 4 days ago). Started on Valium today for right sided neck pain.  Remainder of HPI, ROS, and Exam as above.   Concern for infectious process - specifically concerned for meningitis. Hx of HSV, concern for disseminated HSV infection given his pustular diffuse rash, fever, headache. CXR and urine studies also ordered.   CT Head with no acute process. LP done, see ED attending procedure note. Fluid appeared clear.  Given acyclovir, rocephin and vancomycin. Given IV fluids. Given IV pain medication and PO tylenol for fever.   Labs obtained. Mild leukocytosis, CMP notable for mildly decreased GFR. Normal Creatinine. INR wnl. Lactic acid wnl. CXR showed no acute process.   Urine ordered, urine culture ordered. CSF studies ordered.   Admitted to medicine for presumed meningitis - viral vs bacterial pending at the time of admission.  CSF studies pending at the time of admission and neurology consultation. Covered empirically with antibiotics and acyclovir. Neurology consulted due to presumed meningitis.  This case managed and discussed with my attending physician, Dr. Eber Hong.     Maxine Glenn, MD 08/25/14 [phone]

## 2014-08-24 NOTE — ED Provider Notes (Signed)
The patient is a 43 year old male, he has recently developed a headache over the last 2 weeks which is right-sided and associated with stiff neck and over the last couple of days he has developed a fever and a diffuse pustular rash to his entire body including the face, bilateral arms and bilateral legs and trunk. He has multiple ulcers across his tongue, he has seen multiple physicians prior to this evaluation.  On exam the patient does have a stiff neck, he has some reproducible tenderness over the right side of his scalp, right neck, normal lung sounds, soft abdomen, no peripheral edema.    Labs, CT, LP, likely admit.    I saw and evaluated the patient, reviewed the resident's note and I agree with the findings and plan.   Final diagnoses:  Fever  Headache    Physical Exam  BP 136/82  Pulse 99  Temp(Src) 102.2 F (39 C) (Oral)  Resp 20  Ht 6\' 2"  (1.88 m)  Wt 210 lb (95.255 kg)  BMI 26.95 kg/m2  SpO2 100%  Physical Exam  ED Course  LUMBAR PUNCTURE Date/Time: 08/24/2014 11:30 PM Performed by: Eber HongMILLER, Campbell Kray D Authorized by: Eber HongMILLER, Allessandra Bernardi D Consent: Verbal consent obtained. written consent obtained. Risks and benefits: risks, benefits and alternatives were discussed Consent given by: patient Patient understanding: patient states understanding of the procedure being performed Patient consent: the patient's understanding of the procedure matches consent given Required items: required blood products, implants, devices, and special equipment available Patient identity confirmed: verbally with patient Time out: Immediately prior to procedure a "time out" was called to verify the correct patient, procedure, equipment, support staff and site/side marked as required. Indications: evaluation for infection Anesthesia: local infiltration Local anesthetic: lidocaine 1% with epinephrine Anesthetic total: 5 ml Patient sedated: no Preparation: Patient was prepped and draped in the usual  sterile fashion. Lumbar space: L3-L4 interspace Patient's position: sitting Needle gauge: 20 Needle type: spinal needle - Quincke tip Needle length: 3.5 in Number of attempts: 1 Fluid appearance: clear Tubes of fluid: 4 Total volume: 4 ml Post-procedure: site cleaned and adhesive bandage applied Patient tolerance: Patient tolerated the procedure well with no immediate complications.         Vida RollerBrian D Deion Swift, MD 08/25/14 [phone]

## 2014-08-24 NOTE — ED Notes (Signed)
Pt c/o r sided frontal headache x 2 weeks with four ED visits; reports taking percocet, valium, muscle relaxer, and ibuprofen without relief. Reports fever at home. Red bumps present on tongue. Reports seeing PCP who "gave me some antibiotics" and the bumps are better. Headache described as stabbing pain; has seen a "neck specialist" for neck pain. Pt presents with soft collar for pain in neck. Multiple red bumps noted on bilateral legs and face. Pt reports taking "the whole bottle of ibuprofen in 2 days; there were 80 in the bottle." MD at bedside at this time

## 2014-08-24 NOTE — ED Notes (Signed)
MD at bedside completing LP. 

## 2014-08-24 NOTE — Progress Notes (Signed)
Subjective:    Patient ID: Jack Graves, male    DOB: 01/30/1971, 43 y.o.   MRN: [id number]  HPI  08/18/14 Last week, the patient developed severe right-sided neck pain. He was seen multiple times at the emergency room where CT scan of the neck was negative. He is also seeing a back surgeon who performed x-rays which were negative. He was told that he has a muscle strain in his neck. However over the last 2-3 days, he has developed numerous vesicular ulcers in his mouth. There are 2 yellow-based ulcers on his tongue which are approximately 5-6 mm in diameter. He has 2 similar ulcers on his hard palate and one in the right posterior oropharynx which I believe accounts for his right ear pain. On examination of his right unit, the tympanic membrane appears completely normal. The patient has tender lymphadenopathy in the right anterior cervical chain. He is also complaining of neck stiffness and headache. He has no evidence of encephalitis. He has range of motion without pain in his neck today and there is no evidence of meningitis in the patient has no rash on his palms or on his feet. He has no known exposure to hand foot and mouth disease.  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 This explains his right ear pain. Also explains the pain in his mouth. It may even account for the muscle soreness that he is experiencing on the right side of his neck.  At the present time there is no evidence of meningitis or encephalitis. Patient states he gets similar outbreaks of these aphthous ulcers in his mouth quite frequently however this is the most severe outbreak he has ever had. I will treat the patient with about 6 to milligrams by mouth twice a day for one day. I also gave the patient a prescription for [employer] magic mouthwash 1 teaspoon every 6 hours as needed for a sore throat.  Recheck Monday or sooner if  worse.  08/24/14 Patient is here today for followup. Blisters in his mouth have completely resolved. The pain in his ear and now is completely resolved. He continues to have pain in the right side of his neck along the trapezius muscle. He reports pain with range of motion in the neck. He reports stiffness in the neck. He is also having tenderness to palpation over his right temporal lobe and right occiput. Past Medical History  Diagnosis Date  . Depression    No past surgical history on file. Current Outpatient Prescriptions on File Prior to Visit  Medication Sig Dispense Refill  . methocarbamol (ROBAXIN) 500 MG tablet Take 500 mg by mouth 2 (two) times daily.      . traMADol (ULTRAM) 50 MG tablet Take 1 tablet (50 mg total) by mouth every 6 (six) hours as needed.  15 tablet  0  . valACYclovir (VALTREX) 1000 MG tablet Take 2 tablets (2,000 mg total) by mouth 2 (two) times daily.  4 tablet  0  . valACYclovir (VALTREX) 500 MG tablet Take 1 tablet (500 mg total) by mouth 2 (two) times daily.  6 tablet  0   No current facility-administered medications on file prior to visit.   Allergies  Allergen Reactions  . Penicillins Rash  . Prednisone Rash   History   Social History  . Marital Status: Married    Spouse Name: N/A    Number  of Children: N/A  . Years of Education: N/A   Occupational History  . Not on file.   Social History Main Topics  . Smoking status: Never Smoker   . Smokeless tobacco: Not on file  . Alcohol Use: 0.0 oz/week    1-2 Cans of beer per week  . Drug Use: No  . Sexual Activity: Not on file     Comment: married, military x 15 years   Other Topics Concern  . Not on file   Social History Narrative  . No narrative on file     Review of Systems  All other systems reviewed and are negative.      Objective:   Physical Exam  Vitals reviewed. Constitutional: He is oriented to person, place, and time. He appears well-developed and well-nourished.  HENT:    Right Ear: Tympanic membrane and ear canal normal.  Left Ear: Tympanic membrane and ear canal normal.  Nose: Nose normal.  Mouth/Throat: No oral lesions. No oropharyngeal exudate, posterior oropharyngeal erythema or tonsillar abscesses.  Cardiovascular: Normal rate and regular rhythm.   Pulmonary/Chest: Effort normal and breath sounds normal.  Musculoskeletal:       Cervical back: He exhibits decreased range of motion, tenderness, pain and spasm. He exhibits no bony tenderness.  Lymphadenopathy:    He has no cervical adenopathy.  Neurological: He is alert and oriented to person, place, and time. He has normal reflexes. No cranial nerve deficit. He exhibits normal muscle tone. Coordination normal.          Assessment & Plan:  Wry neck - Plan: diazepam (VALIUM) 10 MG tablet  I believe the patient has a cervical strain and a right neck. Begin IM 10 mg by mouth every 8 hours when necessary muscle spasm. Patient scheduled to see physical therapy beginning tomorrow. I believe the combination of these 2 modalities should help the patient's neck pain. I want him to discontinue all NSAIDs as the patient started to break out in a rash on his face and legs which appears to be urticaria and I believe it may be an allergic reaction to ibuprofen. Recheck next week if no better or immediately if worse.

## 2014-08-25 ENCOUNTER — Observation Stay (HOSPITAL_COMMUNITY)

## 2014-08-25 ENCOUNTER — Telehealth: Payer: Self-pay | Admitting: *Deleted

## 2014-08-25 DIAGNOSIS — M542 Cervicalgia: Secondary | ICD-10-CM

## 2014-08-25 DIAGNOSIS — R519 Headache, unspecified: Secondary | ICD-10-CM | POA: Diagnosis present

## 2014-08-25 DIAGNOSIS — R21 Rash and other nonspecific skin eruption: Secondary | ICD-10-CM

## 2014-08-25 DIAGNOSIS — R509 Fever, unspecified: Secondary | ICD-10-CM

## 2014-08-25 DIAGNOSIS — R51 Headache: Secondary | ICD-10-CM

## 2014-08-25 DIAGNOSIS — G4459 Other complicated headache syndrome: Secondary | ICD-10-CM

## 2014-08-25 DIAGNOSIS — G039 Meningitis, unspecified: Secondary | ICD-10-CM

## 2014-08-25 LAB — COMPREHENSIVE METABOLIC PANEL
ALT: 15 U/L (ref 0–53)
ANION GAP: 14 (ref 5–15)
AST: 13 U/L (ref 0–37)
Albumin: 2.8 g/dL — ABNORMAL LOW (ref 3.5–5.2)
Alkaline Phosphatase: 56 U/L (ref 39–117)
BUN: 10 mg/dL (ref 6–23)
CO2: 21 meq/L (ref 19–32)
Calcium: 8.4 mg/dL (ref 8.4–10.5)
Chloride: 104 mEq/L (ref 96–112)
Creatinine, Ser: 1.03 mg/dL (ref 0.50–1.35)
GFR, EST NON AFRICAN AMERICAN: 86 mL/min — AB (ref >90)
GLUCOSE: 90 mg/dL (ref 70–99)
Potassium: 3.6 mEq/L — ABNORMAL LOW (ref 3.7–5.3)
Sodium: 139 mEq/L (ref 137–147)
TOTAL PROTEIN: 6.6 g/dL (ref 6.0–8.3)
Total Bilirubin: 0.6 mg/dL (ref 0.3–1.2)

## 2014-08-25 LAB — CBC WITH DIFFERENTIAL/PLATELET
BASOS PCT: 0 % (ref 0–1)
Basophils Absolute: 0 10*3/uL (ref 0.0–0.1)
EOS ABS: 0.1 10*3/uL (ref 0.0–0.7)
Eosinophils Relative: 2 % (ref 0–5)
HCT: 38.4 % — ABNORMAL LOW (ref 39.0–52.0)
HEMOGLOBIN: 13.8 g/dL (ref 13.0–17.0)
Lymphocytes Relative: 21 % (ref 12–46)
Lymphs Abs: 1.8 10*3/uL (ref 0.7–4.0)
MCH: 30.3 pg (ref 26.0–34.0)
MCHC: 35.9 g/dL (ref 30.0–36.0)
MCV: 84.2 fL (ref 78.0–100.0)
MONOS PCT: 10 % (ref 3–12)
Monocytes Absolute: 0.9 10*3/uL (ref 0.1–1.0)
NEUTROS ABS: 5.8 10*3/uL (ref 1.7–7.7)
NEUTROS PCT: 67 % (ref 43–77)
PLATELETS: 276 10*3/uL (ref 150–400)
RBC: 4.56 MIL/uL (ref 4.22–5.81)
RDW: 11.9 % (ref 11.5–15.5)
WBC: 8.6 10*3/uL (ref 4.0–10.5)

## 2014-08-25 LAB — URINALYSIS, ROUTINE W REFLEX MICROSCOPIC
Bilirubin Urine: NEGATIVE
Glucose, UA: NEGATIVE mg/dL
HGB URINE DIPSTICK: NEGATIVE
Ketones, ur: NEGATIVE mg/dL
Leukocytes, UA: NEGATIVE
Nitrite: NEGATIVE
PROTEIN: NEGATIVE mg/dL
Specific Gravity, Urine: 1.006 (ref 1.005–1.030)
UROBILINOGEN UA: 0.2 mg/dL (ref 0.0–1.0)
pH: 6.5 (ref 5.0–8.0)

## 2014-08-25 LAB — MAGNESIUM: Magnesium: 1.9 mg/dL (ref 1.5–2.5)

## 2014-08-25 LAB — CSF CELL COUNT WITH DIFFERENTIAL
EOS CSF: NONE SEEN % (ref 0–1)
Eosinophils, CSF: NONE SEEN % (ref 0–1)
RBC Count, CSF: 1 /mm3 — ABNORMAL HIGH
RBC Count, CSF: 78 /mm3 — ABNORMAL HIGH
SEGMENTED NEUTROPHILS-CSF: NONE SEEN % (ref 0–6)
Segmented Neutrophils-CSF: NONE SEEN % (ref 0–6)
TUBE #: 4
Tube #: 1
WBC, CSF: 1 /mm3 (ref 0–5)
WBC, CSF: 1 /mm3 (ref 0–5)

## 2014-08-25 LAB — HIV ANTIBODY (ROUTINE TESTING W REFLEX): HIV 1&2 Ab, 4th Generation: NONREACTIVE

## 2014-08-25 LAB — C-REACTIVE PROTEIN: CRP: 9.4 mg/dL — ABNORMAL HIGH (ref <0.60)

## 2014-08-25 LAB — PROTEIN, CSF: TOTAL PROTEIN, CSF: 21 mg/dL (ref 15–45)

## 2014-08-25 LAB — SEDIMENTATION RATE: SED RATE: 49 mm/h — AB (ref 0–16)

## 2014-08-25 LAB — GRAM STAIN

## 2014-08-25 LAB — GLUCOSE, CSF: GLUCOSE CSF: 72 mg/dL (ref 43–76)

## 2014-08-25 LAB — CRYPTOCOCCAL ANTIGEN, CSF: CRYPTO AG: NEGATIVE

## 2014-08-25 LAB — ANTITHROMBIN III: AntiThromb III Func: 80 % (ref 75–120)

## 2014-08-25 MED ORDER — DEXTROSE 5 % IV SOLN
2.0000 g | Freq: Two times a day (BID) | INTRAVENOUS | Status: DC
  Administered 2014-08-25 (×2): 2 g via INTRAVENOUS
  Filled 2014-08-25 (×3): qty 2

## 2014-08-25 MED ORDER — MORPHINE SULFATE 2 MG/ML IJ SOLN
1.0000 mg | INTRAMUSCULAR | Status: DC | PRN
  Administered 2014-08-25 – 2014-08-27 (×9): 2 mg via INTRAVENOUS
  Filled 2014-08-25 (×11): qty 1

## 2014-08-25 MED ORDER — HEPARIN SODIUM (PORCINE) 5000 UNIT/ML IJ SOLN
5000.0000 [IU] | Freq: Three times a day (TID) | INTRAMUSCULAR | Status: DC
  Administered 2014-08-25 (×3): 5000 [IU] via SUBCUTANEOUS
  Filled 2014-08-25 (×3): qty 1

## 2014-08-25 MED ORDER — ACETAMINOPHEN 325 MG PO TABS
650.0000 mg | ORAL_TABLET | Freq: Once | ORAL | Status: AC
  Administered 2014-08-25: 650 mg via ORAL
  Filled 2014-08-25: qty 2

## 2014-08-25 MED ORDER — POTASSIUM CHLORIDE CRYS ER 20 MEQ PO TBCR
30.0000 meq | EXTENDED_RELEASE_TABLET | Freq: Once | ORAL | Status: AC
  Administered 2014-08-25: 30 meq via ORAL
  Filled 2014-08-25 (×2): qty 1

## 2014-08-25 MED ORDER — METRONIDAZOLE IN NACL 5-0.79 MG/ML-% IV SOLN
500.0000 mg | Freq: Three times a day (TID) | INTRAVENOUS | Status: DC
  Administered 2014-08-25 – 2014-08-29 (×12): 500 mg via INTRAVENOUS
  Filled 2014-08-25 (×12): qty 100

## 2014-08-25 MED ORDER — DEXTROSE 5 % IV SOLN
850.0000 mg | Freq: Three times a day (TID) | INTRAVENOUS | Status: DC
  Administered 2014-08-25: 850 mg via INTRAVENOUS
  Filled 2014-08-25 (×3): qty 17

## 2014-08-25 MED ORDER — DIPHENHYDRAMINE HCL 50 MG/ML IJ SOLN
25.0000 mg | Freq: Once | INTRAMUSCULAR | Status: AC
  Administered 2014-08-25: 25 mg via INTRAVENOUS
  Filled 2014-08-25: qty 1

## 2014-08-25 MED ORDER — GADOBENATE DIMEGLUMINE 529 MG/ML IV SOLN
20.0000 mL | Freq: Once | INTRAVENOUS | Status: AC | PRN
  Administered 2014-08-25: 20 mL via INTRAVENOUS

## 2014-08-25 MED ORDER — VANCOMYCIN HCL IN DEXTROSE 1-5 GM/200ML-% IV SOLN
1000.0000 mg | Freq: Three times a day (TID) | INTRAVENOUS | Status: DC
  Administered 2014-08-25 – 2014-08-27 (×7): 1000 mg via INTRAVENOUS
  Filled 2014-08-25 (×8): qty 200

## 2014-08-25 MED ORDER — ENOXAPARIN SODIUM 100 MG/ML ~~LOC~~ SOLN
1.0000 mg/kg | Freq: Two times a day (BID) | SUBCUTANEOUS | Status: DC
  Administered 2014-08-25 – 2014-08-28 (×6): 100 mg via SUBCUTANEOUS
  Filled 2014-08-25 (×7): qty 1

## 2014-08-25 MED ORDER — SODIUM CHLORIDE 0.9 % IV BOLUS (SEPSIS)
1000.0000 mL | Freq: Once | INTRAVENOUS | Status: AC
  Administered 2014-08-25: 1000 mL via INTRAVENOUS

## 2014-08-25 MED ORDER — DEXTROSE 5 % IV SOLN
2.0000 g | INTRAVENOUS | Status: DC
  Administered 2014-08-25 – 2014-08-28 (×4): 2 g via INTRAVENOUS
  Filled 2014-08-25 (×7): qty 2

## 2014-08-25 MED ORDER — FENTANYL CITRATE 0.05 MG/ML IJ SOLN
50.0000 ug | Freq: Once | INTRAMUSCULAR | Status: AC
  Administered 2014-08-25: 50 ug via INTRAVENOUS
  Filled 2014-08-25: qty 2

## 2014-08-25 MED ORDER — VANCOMYCIN HCL IN DEXTROSE 1-5 GM/200ML-% IV SOLN
1000.0000 mg | Freq: Three times a day (TID) | INTRAVENOUS | Status: DC
  Administered 2014-08-25: 1000 mg via INTRAVENOUS
  Filled 2014-08-25 (×3): qty 200

## 2014-08-25 NOTE — ED Notes (Signed)
Dr Alvester MorinNewton called regarding possible reaction to Vancomycin.  Instructed to stop infusion and adm Benadryl

## 2014-08-25 NOTE — Progress Notes (Addendum)
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 Case discussed with Dr. Drue SecondSnider from ID and Dr. Cyril Mourningamillo from Neurology. - start IV antibiotics - start anticoagulation with Lovenox eventually transitioning to Coumadin  Jack Kanouse M. [name], MD [facility] [phone]

## 2014-08-25 NOTE — Progress Notes (Signed)
ANTIBIOTIC CONSULT NOTE - INITIAL  Pharmacy Consult for vancomycin Indication: r/o meningitis  Allergies  Allergen Reactions  . Penicillins Rash  . Prednisone Rash    Patient Measurements: Height: 6\' 2"  (188 cm) Weight: 210 lb (95.255 kg) IBW/kg (Calculated) : 82.2  Vital Signs: Temp: 99.6 F (37.6 C) (10/23 0040) Temp Source: Oral (10/23 0040) BP: 132/74 mmHg (10/23 0100) Pulse Rate: 83 (10/23 0100)  Labs:  Recent Labs  08/24/14 2123  WBC 11.3*  HGB 15.1  PLT 325  CREATININE 1.16   Estimated Creatinine Clearance: 93.5 ml/min (by C-G formula based on Cr of 1.16).   Microbiology: Recent Results (from the past 720 hour(s))  GRAM STAIN     Status: None   Collection Time    08/24/14 11:51 PM      Result Value Ref Range Status   Specimen Description CSF   Final   Special Requests NONE   Final   Gram Stain     Final   Value: WBC PRESENT, PREDOMINANTLY MONONUCLEAR     NO ORGANISMS SEEN     CYTOSPUN   Report Status 08/25/2014 FINAL   Final    Medical History: Past Medical History  Diagnosis Date  . 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  Goal of Therapy:  Vancomycin trough level 15-20 mcg/ml  Plan:  Acyclovir and ceftriaxone have been ordered appropriately; rec'd vanc 2g IV in ED; will continue with vancomycin 1g IV Q8H and monitor CBC, Cx, levels prn.  Vernard GamblesVeronda Analycia Khokhar, PharmD, BCPS  08/25/2014,1:35 AM

## 2014-08-25 NOTE — Progress Notes (Addendum)
ANTIBIOTIC & ANTICOAGULATION CONSULT NOTE - INITIAL  Pharmacy Consult for Vancomycin; Lovenox Indication: septic transverse sinus thrombosis  Allergies  Allergen Reactions  . Penicillins Rash  . Prednisone Rash    Patient Measurements: Height: 6\' 2"  (188 cm) Weight: 217 lb 9.6 oz (98.703 kg) IBW/kg (Calculated) : 82.2 Vital Signs: Temp: 98 F (36.7 C) (10/23 1041) Temp Source: Oral (10/23 1041) BP: 129/75 mmHg (10/23 1041) Pulse Rate: 78 (10/23 1041) Intake/Output from previous day: 10/22 0701 - 10/23 0700 In: 200 [I.V.:200] Out: -  Intake/Output from this shift:    Labs:  Recent Labs  08/24/14 2123 08/25/14 0449  WBC 11.3* 8.6  HGB 15.1 13.8  PLT 325 276  CREATININE 1.16 1.03   Estimated Creatinine Clearance: 113.8 ml/min (by C-G formula based on Cr of 1.03). No results found for this basename: VANCOTROUGH, Leodis BinetVANCOPEAK, VANCORANDOM, GENTTROUGH, GENTPEAK, GENTRANDOM, TOBRATROUGH, TOBRAPEAK, TOBRARND, AMIKACINPEAK, AMIKACINTROU, AMIKACIN,  in the last 72 hours   Microbiology: Recent Results (from the past 720 hour(s))  CSF CULTURE     Status: None   Collection Time    08/24/14 11:51 PM      Result Value Ref Range Status   Specimen Description CSF   Final   Special Requests NONE   Final   Gram Stain     Final   Value: CYTOSPIN SLIDE WBC PRESENT, PREDOMINANTLY MONONUCLEAR     NO ORGANISMS SEEN     [facility]     [facility] at [facility]   Culture PENDING   Incomplete   Report Status PENDING   Incomplete  GRAM STAIN     Status: None   Collection Time    08/24/14 11:51 PM      Result Value Ref Range Status   Specimen Description CSF   Final   Special Requests NONE   Final   Gram Stain     Final   Value: WBC PRESENT, PREDOMINANTLY MONONUCLEAR     NO ORGANISMS SEEN     CYTOSPUN   Report Status 08/25/2014 FINAL   Final    Medical History: Past Medical History  Diagnosis Date  . Depression     Medications:   Anti-infectives   Start     Dose/Rate Route Frequency Ordered Stop   08/25/14 1730  cefTRIAXone (ROCEPHIN) 2 g in dextrose 5 % 50 mL IVPB     2 g 100 mL/hr over 30 Minutes Intravenous Every 24 hours 08/25/14 1716     08/25/14 1730  metroNIDAZOLE (FLAGYL) IVPB 500 mg     500 mg 100 mL/hr over 60 Minutes Intravenous Every 8 hours 08/25/14 1716     08/25/14 0800  vancomycin (VANCOCIN) IVPB 1000 mg/200 mL premix  Status:  Discontinued     1,000 mg 200 mL/hr over 60 Minutes Intravenous Every 8 hours 08/25/14 0133 08/25/14 1136   08/25/14 0600  acyclovir (ZOVIRAX) 850 mg in dextrose 5 % 150 mL IVPB  Status:  Discontinued     850 mg 167 mL/hr over 60 Minutes Intravenous 3 times per day 08/25/14 0130 08/25/14 1235   08/25/14 0145  cefTRIAXone (ROCEPHIN) 2 g in dextrose 5 % 50 mL IVPB  Status:  Discontinued     2 g 100 mL/hr over 30 Minutes Intravenous Every 12 hours 08/25/14 0130 08/25/14 1136   08/25/14 0000  cefTRIAXone (ROCEPHIN) 2 g in dextrose 5 % 50 mL IVPB     2 g 100 mL/hr over 30 Minutes Intravenous  Once 08/24/14  2347 08/25/14 0055   08/25/14 0000  vancomycin (VANCOCIN) 2,000 mg in sodium chloride 0.9 % 500 mL IVPB     2,000 mg 250 mL/hr over 120 Minutes Intravenous  Once 08/24/14 2347 08/25/14 0120   08/24/14 2200  acyclovir (ZOVIRAX) 820 mg in dextrose 5 % 150 mL IVPB     10 mg/kg  82.2 kg (Ideal) 166.4 mL/hr over 60 Minutes Intravenous  Once 08/24/14 2144 08/25/14 0117     Assessment: 43 year old male admitted with 2 wk history of headache, neck pain, and intermittent fevers started on vancomycin earlier today but discontinued as unremarkable CSF now re-ordered by Infectious Disease service for 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 Blood cultures have been sent. Patient received 2g of vancomycin at 0055 AM this morning and another 1g dose at 1027 AM. WBC is within normal limits. Patient is afebrile. SCr is 1.03 with estimated CrCl >100 mL/min.   Note in chart that  patient may have had possible reaction with 2gm loading dose. No notes regarding intolerance of 2nd 1g dose.  Possible red man's syndrome which can be prevented by slowing the infusion.   Also, adding Lovenox for anticoagulation of septic transverse thrombosis. CBC down some since admission (Hgb 15.1 >>13.8, platelets 325 >>276). Patient is currently receiving sq heparin - last dose at 1620 PM.   Goal of Therapy:  Vancomycin trough level 15-20 mcg/ml Lovenox - anti Xa levels as needed with goal of 0.6-1 units/mL  Plan:  1. Vancomycin 1 gram IV every 8 hours. Administration instructions added to slow infusion if redness develops. 2. Monitor renal function, culture results, and clinical status. 3. Will plan for vancomycin trough at [facility].  4. Lovenox 100mg  (1mg /kg) SQ q12h. 5. Discontinue sq heparin.     Link SnufferJessica Seaver Machia, PharmD, BCPS Clinical Pharmacist [phone] 08/25/2014,5:17 PM

## 2014-08-25 NOTE — ED Notes (Signed)
Dr. Stewart in to see patient.

## 2014-08-25 NOTE — ED Provider Notes (Signed)
I saw and evaluated the patient, reviewed the resident's note and I agree with the findings and plan.  Please see my separate note regarding my evaluation of the patient.    Vida RollerBrian D Trellis Guirguis, MD 08/25/14 [phone]

## 2014-08-25 NOTE — Consult Note (Signed)
  [facility]  Total days of antibiotics 1        Day 2 ctx        Day 2 vanco        Day 2 acyclovir       Reason for Consult: fever, ha x 2 wk    Referring Physician: gherghe  Active Problems:   Headache   Meningitis   Rash    HPI: Jack Graves is a 43 y.o. male who no significant past medical history who had dental work to right jaw on oct 9th. He started to have right sided headache, neck pain, and intermittent fevers start roughly on Oct 12th. He states that it progressed to get worse without relief with using exceeding amounts of ibuprofen and tylenol. He went to ED several times for evaluation but predominantly for neck pain. 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 Went to see Dr. Brooks at [facility] who recommend injections. He used ketorolac, robaxin, vicodin with only mild relief. Seen in ED on 10/16 where he was afebrile at the time, lonely neck pain but not necessarily nuchal rigidity and received IM toradol. Saw his pcp that day as well, where he was noted to have oral aphthous ulcers and some cervical adenopathy.no nuchal rigidity at that time. 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 On follow up on 10/22, Blisters in his mouth have completely resolved.  He still had pain in the right side of his neck along the trapezius muscle. He reports pain with range of motion in the neck. He reports stiffness in the neck. He is also having tenderness to palpation over his right temporal lobe and right occiput. He also started to have rash thought to be urticaria by his PCP. He was given valium in order to help with cervical strain/torticollis. Due to worsening headache, fevers of 102 he later presented to the ED on 10/22. He underwent NCHCT which was unremarkable, had LP, which was normal with WBC 1, protein of 21 and glu 72. Gram stain no organisms. Serum wbc 11.3 with 73% N, cr 1.16. Blood and urine cx pending. He was started on acy, vanco, and  ctx empirically but subseqeuntly discontinued as CSF profile was unremarkable.   No sick contacts, no travel, no water exposure, no animal exposures  Past Medical History  Diagnosis Date  . Depression     Allergies:  Allergies  Allergen Reactions  . Penicillins Rash  . Prednisone Rash    Current antibiotics:   MEDICATIONS: . acyclovir  850 mg Intravenous 3 times per day  . heparin  5,000 Units Subcutaneous 3 times per day    History  Substance Use Topics  . Smoking status: Never Smoker   . Smokeless tobacco: Not on file  . Alcohol Use: 0.0 oz/week    1-2 Cans of beer per week    No family history on file.  Review of Systems  Constitutional:positive for feverbut , chills, diaphoresis, activity change, appetite change, fatigue and unexpected weight change.  HENT: Negative for congestion, sore throat, rhinorrhea, sneezing, trouble swallowing and sinus pressure.  Eyes: Negative for photophobia and visual disturbance.  Respiratory: Negative for cough, chest tightness, shortness of breath, wheezing and stridor.  Cardiovascular: Negative for chest pain, palpitations and leg swelling.  Gastrointestinal: Negative for nausea, vomiting, abdominal pain, diarrhea, constipation, blood in stool, abdominal distention and anal bleeding.  Genitourinary: Negative for dysuria, hematuria, flank pain and difficulty urinating.  Musculoskeletal:    Neck pain. Negative for myalgias, back pain, joint swelling, arthralgias and gait problem.  Skin: Negative for color change, pallor, rash and wound.  Neurological: headache. Negative for dizziness, tremors, weakness and light-headedness.  Hematological: Negative for adenopathy. Does not bruise/bleed easily.  Psychiatric/Behavioral: Negative for behavioral problems, confusion, sleep disturbance, dysphoric mood, decreased concentration and agitation.     OBJECTIVE: Temp:  [98 F (36.7 C)-102.2 F (39 C)] 98 F (36.7 C) (10/23 1041) Pulse Rate:   [78-99] 78 (10/23 1041) Resp:  [14-28] 16 (10/23 1041) BP: (120-146)/(69-83) 129/75 mmHg (10/23 1041) SpO2:  [98 %-100 %] 100 % (10/23 1041) Weight:  [210 lb (95.255 kg)-217 lb 9.6 oz (98.703 kg)] 217 lb 9.6 oz (98.703 kg) (10/23 0237) Physical Exam  Constitutional: He is oriented to person, place, and time. He appears well-developed and well-nourished. No distress. Wearing soft c collar HENT: neck - + nuchal rigidity but also pain with side to side motion.  Mouth/Throat: Oropharynx is clear and moist. No oropharyngeal exudate.  Cardiovascular: Normal rate, regular rhythm and normal heart sounds. Exam reveals no gallop and no friction rub.  No murmur heard.  Pulmonary/Chest: Effort normal and breath sounds normal. No respiratory distress. He has no wheezes.  Abdominal: Soft. Bowel sounds are normal. He exhibits no distension. There is no tenderness.  Lymphadenopathy: tenderness to right anterior cervical chain  Neurolonopathy. gical: He is alert and oriented to person, place, and time.  Skin: Skin is warm and dry. No rash noted. No erythema.  Psychiatric: anxious   LABS: Results for orders placed during the hospital encounter of 08/24/14 (from the past 48 hour(s))  CBC WITH DIFFERENTIAL     Status: Abnormal   Collection Time    08/24/14  9:23 PM      Result Value Ref Range   WBC 11.3 (*) 4.0 - 10.5 K/uL   RBC 4.87  4.22 - 5.81 MIL/uL   Hemoglobin 15.1  13.0 - 17.0 g/dL   HCT 41.9  39.0 - 52.0 %   MCV 86.0  78.0 - 100.0 fL   MCH 31.0  26.0 - 34.0 pg   MCHC 36.0  30.0 - 36.0 g/dL   RDW 11.8  11.5 - 15.5 %   Platelets 325  150 - 400 K/uL   Neutrophils Relative % 73  43 - 77 %   Neutro Abs 8.3 (*) 1.7 - 7.7 K/uL   Lymphocytes Relative 16  12 - 46 %   Lymphs Abs 1.8  0.7 - 4.0 K/uL   Monocytes Relative 9  3 - 12 %   Monocytes Absolute 1.0  0.1 - 1.0 K/uL   Eosinophils Relative 2  0 - 5 %   Eosinophils Absolute 0.2  0.0 - 0.7 K/uL   Basophils Relative 0  0 - 1 %   Basophils  Absolute 0.0  0.0 - 0.1 K/uL  COMPREHENSIVE METABOLIC PANEL     Status: Abnormal   Collection Time    08/24/14  9:23 PM      Result Value Ref Range   Sodium 140  137 - 147 mEq/L   Potassium 3.5 (*) 3.7 - 5.3 mEq/L   Chloride 100  96 - 112 mEq/L   CO2 26  19 - 32 mEq/L   Glucose, Bld 104 (*) 70 - 99 mg/dL   BUN 12  6 - 23 mg/dL   Creatinine, Ser 1.16  0.50 - 1.35 mg/dL   Calcium 9.1  8.4 - 10.5 mg/dL   Total Protein 7.7  6.0 - 8.3 g/dL   Albumin 3.5  3.5 - 5.2 g/dL   AST 15  0 - 37 U/L   ALT 18  0 - 53 U/L   Alkaline Phosphatase 62  39 - 117 U/L   Total Bilirubin 0.9  0.3 - 1.2 mg/dL   GFR calc non Af Amer 75 (*) >90 mL/min   GFR calc Af Amer 86 (*) >90 mL/min   Comment: (NOTE)     The eGFR has been calculated using the CKD EPI equation.     This calculation has not been validated in all clinical situations.     eGFR's persistently <90 mL/min signify possible Chronic Kidney     Disease.   Anion gap 14  5 - 15  PROTIME-INR     Status: Abnormal   Collection Time    08/24/14  9:23 PM      Result Value Ref Range   Prothrombin Time 15.4 (*) 11.6 - 15.2 seconds   INR 1.21  0.00 - 1.49  I-STAT CG4 LACTIC ACID, ED     Status: None   Collection Time    08/24/14  9:47 PM      Result Value Ref Range   Lactic Acid, Venous 1.00  0.5 - 2.2 mmol/L  LACTIC ACID, PLASMA     Status: None   Collection Time    08/24/14  9:54 PM      Result Value Ref Range   Lactic Acid, Venous 1.0  0.5 - 2.2 mmol/L  CSF CELL COUNT WITH DIFFERENTIAL     Status: Abnormal   Collection Time    08/24/14 11:51 PM      Result Value Ref Range   Tube # 1     Color, CSF PINK (*) COLORLESS   Appearance, CSF HAZY (*) CLEAR   Supernatant COLORLESS     RBC Count, CSF 78 (*) 0 /cu mm   WBC, CSF 1  0 - 5 /cu mm   Segmented Neutrophils-CSF NONE SEEN  0 - 6 %   Lymphs, CSF FEW  40 - 80 %   Monocyte-Macrophage-Spinal Fluid RARE  15 - 45 %   Eosinophils, CSF NONE SEEN  0 - 1 %  CSF CULTURE     Status: None    Collection Time    08/24/14 11:51 PM      Result Value Ref Range   Specimen Description CSF     Special Requests NONE     Gram Stain       Value: CYTOSPIN SLIDE WBC PRESENT, PREDOMINANTLY MONONUCLEAR     NO ORGANISMS SEEN     Performed at [facility]     Performed at [facility]   Culture PENDING     Report Status PENDING    GRAM STAIN     Status: None   Collection Time    08/24/14 11:51 PM      Result Value Ref Range   Specimen Description CSF     Special Requests NONE     Gram Stain       Value: WBC PRESENT, PREDOMINANTLY MONONUCLEAR     NO ORGANISMS SEEN     CYTOSPUN   Report Status 08/25/2014 FINAL    GLUCOSE, CSF     Status: None   Collection Time    08/24/14 11:51 PM      Result Value Ref Range   Glucose, CSF 72  43 - 76 mg/dL  PROTEIN, CSF       Status: None   Collection Time    08/24/14 11:51 PM      Result Value Ref Range   Total  Protein, CSF 21  15 - 45 mg/dL  CRYPTOCOCCAL ANTIGEN, CSF     Status: None   Collection Time    08/24/14 11:51 PM      Result Value Ref Range   Crypto Ag NEGATIVE  NEGATIVE   Cryptococcal Ag Titer NOT INDICATED  NOT INDICATED  CSF CELL COUNT WITH DIFFERENTIAL     Status: Abnormal   Collection Time    08/24/14 11:52 PM      Result Value Ref Range   Tube # 4     Color, CSF COLORLESS  COLORLESS   Appearance, CSF CLEAR  CLEAR   Supernatant NOT INDICATED     RBC Count, CSF 1 (*) 0 /cu mm   WBC, CSF 1  0 - 5 /cu mm   Segmented Neutrophils-CSF NONE SEEN  0 - 6 %   Lymphs, CSF FEW  40 - 80 %   Monocyte-Macrophage-Spinal Fluid RARE  15 - 45 %   Eosinophils, CSF NONE SEEN  0 - 1 %  URINALYSIS, ROUTINE W REFLEX MICROSCOPIC     Status: None   Collection Time    08/25/14  2:20 AM      Result Value Ref Range   Color, Urine YELLOW  YELLOW   APPearance CLEAR  CLEAR   Specific Gravity, Urine 1.006  1.005 - 1.030   pH 6.5  5.0 - 8.0   Glucose, UA NEGATIVE  NEGATIVE mg/dL   Hgb urine dipstick NEGATIVE  NEGATIVE   Bilirubin  Urine NEGATIVE  NEGATIVE   Ketones, ur NEGATIVE  NEGATIVE mg/dL   Protein, ur NEGATIVE  NEGATIVE mg/dL   Urobilinogen, UA 0.2  0.0 - 1.0 mg/dL   Nitrite NEGATIVE  NEGATIVE   Leukocytes, UA NEGATIVE  NEGATIVE   Comment: MICROSCOPIC NOT DONE ON URINES WITH NEGATIVE PROTEIN, BLOOD, LEUKOCYTES, NITRITE, OR GLUCOSE <1000 mg/dL.  COMPREHENSIVE METABOLIC PANEL     Status: Abnormal   Collection Time    08/25/14  4:49 AM      Result Value Ref Range   Sodium 139  137 - 147 mEq/L   Potassium 3.6 (*) 3.7 - 5.3 mEq/L   Chloride 104  96 - 112 mEq/L   CO2 21  19 - 32 mEq/L   Glucose, Bld 90  70 - 99 mg/dL   BUN 10  6 - 23 mg/dL   Creatinine, Ser 1.03  0.50 - 1.35 mg/dL   Calcium 8.4  8.4 - 10.5 mg/dL   Total Protein 6.6  6.0 - 8.3 g/dL   Albumin 2.8 (*) 3.5 - 5.2 g/dL   AST 13  0 - 37 U/L   ALT 15  0 - 53 U/L   Alkaline Phosphatase 56  39 - 117 U/L   Total Bilirubin 0.6  0.3 - 1.2 mg/dL   GFR calc non Af Amer 86 (*) >90 mL/min   GFR calc Af Amer >90  >90 mL/min   Comment: (NOTE)     The eGFR has been calculated using the CKD EPI equation.     This calculation has not been validated in all clinical situations.     eGFR's persistently <90 mL/min signify possible Chronic Kidney     Disease.   Anion gap 14  5 - 15  CBC WITH DIFFERENTIAL     Status: Abnormal   Collection Time  08/25/14  4:49 AM      Result Value Ref Range   WBC 8.6  4.0 - 10.5 K/uL   RBC 4.56  4.22 - 5.81 MIL/uL   Hemoglobin 13.8  13.0 - 17.0 g/dL   HCT 38.4 (*) 39.0 - 52.0 %   MCV 84.2  78.0 - 100.0 fL   MCH 30.3  26.0 - 34.0 pg   MCHC 35.9  30.0 - 36.0 g/dL   RDW 11.9  11.5 - 15.5 %   Platelets 276  150 - 400 K/uL   Neutrophils Relative % 67  43 - 77 %   Neutro Abs 5.8  1.7 - 7.7 K/uL   Lymphocytes Relative 21  12 - 46 %   Lymphs Abs 1.8  0.7 - 4.0 K/uL   Monocytes Relative 10  3 - 12 %   Monocytes Absolute 0.9  0.1 - 1.0 K/uL   Eosinophils Relative 2  0 - 5 %   Eosinophils Absolute 0.1  0.0 - 0.7 K/uL    Basophils Relative 0  0 - 1 %   Basophils Absolute 0.0  0.0 - 0.1 K/uL  MAGNESIUM     Status: None   Collection Time    08/25/14  4:49 AM      Result Value Ref Range   Magnesium 1.9  1.5 - 2.5 mg/dL  SEDIMENTATION RATE     Status: Abnormal   Collection Time    08/25/14  4:49 AM      Result Value Ref Range   Sed Rate 49 (*) 0 - 16 mm/hr    MICRO: Blood cx pending Urine cx pending Csf cx pending  IMAGING: Ct Head Wo Contrast  08/24/2014   CLINICAL DATA:  Severe right side headache  EXAM: CT HEAD WITHOUT CONTRAST  TECHNIQUE: Contiguous axial images were obtained from the base of the skull through the vertex without intravenous contrast.  COMPARISON:  None  FINDINGS: No skull fracture is noted. Paranasal sinuses and mastoid air cells are unremarkable. No intracranial hemorrhage, mass effect or midline shift. No acute cortical infarction. No mass lesion is noted on this unenhanced scan. The gray and white-matter differentiation is preserved.  IMPRESSION: No acute intracranial abnormality.   Electronically Signed   By: Lahoma Crocker M.D.   On: 08/24/2014 22:58   Dg Chest Port 1 View  08/24/2014   CLINICAL DATA:  43 year old male with acute fever, right side head and neck pain. Initial encounter.  EXAM: PORTABLE CHEST - 1 VIEW  COMPARISON:  None.  FINDINGS: Portable AP semi upright view at at 2159 hrs. Mildly low lung volumes. Normal cardiac size and mediastinal contours. No pneumothorax, pulmonary edema, pleural effusion or confluent pulmonary opacity. No osseous abnormality identified.  IMPRESSION: Somewhat low lung volumes, otherwise no acute cardiopulmonary abnormality.   Electronically Signed   By: Lars Pinks M.D.   On: 08/24/2014 22:18    Assessment/Plan:  43yo M with 2 wk history of right sided headache, neckpain, intermittent fevers that started roughly 3 days after dental procedure.  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 Plus metronidazole  500mg IV Q 8hr since he had recent dental procedure - recommend to do coagulation work up checking protein c, s, factor v, homocystein, lupus anticoag, cardiolipin - don't think he would benefit from anticoagulation but would ask vascular to confirm - hopefully blood cx will be positive to help direct therapy. Most cases of septic venous sinus thrombosis are thought to be due to staph aureus.  Dr. Hatcher to see over the weekend  Cynthia B. Snider MD MPH [facility] [phone]   

## 2014-08-25 NOTE — Progress Notes (Signed)
Patient arrived to floor at 227 with spouse from ED, Patient oriented to room and call bell system. Patient placed on droplet precautions. Given opportunity to answer questions and address concerns. Will continue to monitor accordingly.

## 2014-08-25 NOTE — H&P (Addendum)
Hospitalist Admission History and Physical  Patient name: Jack Graves Medical record number: [id number] Date of birth: 1971-07-31 Age: 43 y.o. Gender: male  Primary Care Provider: [name], MD  Chief Complaint: headache, fever, rash, concern for meningitis History of Present Illness:This is a 43 y.o. year old male with no significant past medical history of presenting with headache, fever, rash concerning for meningitis. Patient reports progressive severe right-sided headache over the past 2 weeks. Has seen his PCP about symptoms over the same time frame. Had some noted oral lesions within this time frame that was concerning for herpangina  with aphthous ulcers per PCP note. was given prescription for Valtrex and Magic mouthwash with resolution of oral lesions. Patient has followup with his PCP the following week with worsening headache. Was put on NSAIDs. Patient seemed to have developed rash after use of NSAIDs per patient. Was recently switched to Valium today for headache.   presents in the ER MAXIMUM TEMPERATURE 102.2, heart rate in the 70s to 90s, respirations intense 20s, blood pressure in the 110s to 140s, satting greater than 99% on room air. White blood cell count 11.3, hemoglobin 15, creatinine 1.16 INR 1.21. Head CT within normal limits. EDP concern for meningitis. LP performed at the bedside. CSF studies including protein, glucose, cell count differential, Gram stain, culture, HSV, VDRL obtained. Patient started empirically on IV acyclovir, meningitic dose and Rocephin, vancomycin. I have asked for a formal neurology consult. 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 [place] Denies any recent appointments. No medications. States his immunizations are up-to-date. Denies any prior history of medical problems.  Assessment and Plan: [name] is a 43 y.o. year old male presenting with headache, fever, rash, r/o meningitis  Active Problems:   Headache   Meningitis   1- r/o Meningitis -Continue empiric coverage with IV acyclovir, Rocephin, vancomycin -Followup CSF studies -Also check sedimentation rate, Lyme titers, HIV, ANA -DDx also includes ? Bell's palsy vs. Cluster headache given hemidistribution of HA-no facial muscle weakness or hyperacusis, no photophobia  -Follow neurology recommendations -Blood and urine cultures  2-Rash -Noted pustular lesions on face upper extremities and lower chimneys on exam. -Nonpruritic -Concern for infectious/inflammatory process in the setting of above -Workup as above -? Allergic reaction this patient reports onset of rash with NSAID use -Biopsy as clinically indicated  FEN/GI: heart healthy diet Prophylaxis:  subcutaneous heparin Disposition:  pending further evaluation Code Status: Full Code   Patient Active Problem List   Diagnosis Date Noted  . Headache 08/25/2014  . Meningitis 08/25/2014  . Hematoma of toe of right foot 12/13/2013   Past Medical History: Past Medical History  Diagnosis Date  . Depression     Past Surgical History: History reviewed. No pertinent past surgical history.  Social History: History   Social History  . Marital Status: Married    Spouse Name: N/A    Number of Children: N/A  . Years of Education: N/A   Social History Main Topics  . Smoking status: Never Smoker   . Smokeless tobacco: None  . Alcohol Use: 0.0 oz/week    1-2 Cans of beer per week  . Drug Use: No  . Sexual Activity: None     Comment: married, military x 15 years   Other Topics Concern  . None   Social History Narrative  . None    Family History: No family history on file.  Allergies: Allergies  Allergen Reactions  . Penicillins Rash  .  Prednisone Rash    Current Facility-Administered Medications  Medication Dose Route Frequency Provider Last Rate Last Dose  . acyclovir (ZOVIRAX) 820 mg in dextrose 5 % 150 mL IVPB  10 mg/kg (Ideal) Intravenous Once  Maxine GlennAnn Batista, MD 166.4 mL/hr at 08/25/14 0005 820 mg at 08/25/14 0005  . cefTRIAXone (ROCEPHIN) 2 g in dextrose 5 % 50 mL IVPB  2 g Intravenous Once Maxine GlennAnn Batista, MD 100 mL/hr at 08/25/14 0036 2 g at 08/25/14 0036  . heparin injection 5,000 Units  5,000 Units Subcutaneous 3 times per day Doree AlbeeSteven Eladio Dentremont, MD      . vancomycin (VANCOCIN) 2,000 mg in sodium chloride 0.9 % 500 mL IVPB  2,000 mg Intravenous Once Maxine GlennAnn Batista, MD       Current Outpatient Prescriptions  Medication Sig Dispense Refill  . diazepam (VALIUM) 10 MG tablet Take 10 mg by mouth every 8 (eight) hours as needed for anxiety (muscle pain).       Review Of Systems: 12 point ROS negative except as noted above in HPI.  Physical Exam: Filed Vitals:   08/25/14 0040  BP:   Pulse:   Temp: 99.6 F (37.6 C)  Resp:     General: alert and cooperative HEENT: PERRLA and + focal pustular lesions on face, no oral lesions noted  Heart: S1, S2 normal, no murmur, rub or gallop, regular rate and rhythm Lungs: clear to auscultation, no wheezes or rales and unlabored breathing Abdomen: abdomen is soft without significant tenderness, masses, organomegaly or guarding Extremities: full ROM, noted pustular lesions on LEs  Skin:noted focal pustular lesions on face, anterior chest and lower extremities  Neurology: decreased neck ROM 2/2 pain, kernig and brudzinski negative   Labs and Imaging: Lab Results  Component Value Date/Time   NA 140 08/24/2014  9:23 PM   K 3.5* 08/24/2014  9:23 PM   CL 100 08/24/2014  9:23 PM   CO2 26 08/24/2014  9:23 PM   BUN 12 08/24/2014  9:23 PM   CREATININE 1.16 08/24/2014  9:23 PM   GLUCOSE 104* 08/24/2014  9:23 PM   Lab Results  Component Value Date   WBC 11.3* 08/24/2014   HGB 15.1 08/24/2014   HCT 41.9 08/24/2014   MCV 86.0 08/24/2014   PLT 325 08/24/2014      Ct Head Wo Contrast  08/24/2014   CLINICAL DATA:  Severe right side headache  EXAM: CT HEAD WITHOUT CONTRAST  TECHNIQUE: Contiguous axial  images were obtained from the base of the skull through the vertex without intravenous contrast.  COMPARISON:  None  FINDINGS: No skull fracture is noted. Paranasal sinuses and mastoid air cells are unremarkable. No intracranial hemorrhage, mass effect or midline shift. No acute cortical infarction. No mass lesion is noted on this unenhanced scan. The gray and white-matter differentiation is preserved.  IMPRESSION: No acute intracranial abnormality.   Electronically Signed   By: Natasha MeadLiviu  Pop M.D.   On: 08/24/2014 22:58   Dg Chest Port 1 View  08/24/2014   CLINICAL DATA:  43 year old male with acute fever, right side head and neck pain. Initial encounter.  EXAM: PORTABLE CHEST - 1 VIEW  COMPARISON:  None.  FINDINGS: Portable AP semi upright view at at 2159 hrs. Mildly low lung volumes. Normal cardiac size and mediastinal contours. No pneumothorax, pulmonary edema, pleural effusion or confluent pulmonary opacity. No osseous abnormality identified.  IMPRESSION: Somewhat low lung volumes, otherwise no acute cardiopulmonary abnormality.   Electronically Signed   By: Augusto GambleLee  Hall  M.D.   On: 08/24/2014 22:18           Doree AlbeeSteven Kirby Argueta MD  Pager: [phone]

## 2014-08-25 NOTE — Progress Notes (Signed)
UR completed 

## 2014-08-25 NOTE — Telephone Encounter (Signed)
Submitted referral thru [employer] federal services [employer] with approval number for DR.Brooks at [facility], approval number is [id number], [facility]

## 2014-08-25 NOTE — Progress Notes (Signed)
Paged physician on call to notify that patient is having headache and patient doesn't pain medication listed.

## 2014-08-25 NOTE — Consult Note (Addendum)
Reason for Consult: Headache neck pain and fever; possible meningitis.  HPI:                                                                                                                                          Jack Graves is an 43 y.o. male with a history of depression, presenting with headache for 2 weeks involving right side of his head as well as neck pain involving right side of his neck. His been no associated nausea and he said no visual changes. He also has not experienced any focal neurological deficits. CT scan of his head showed no acute intracranial abnormality. Patient's temperature has ranged from 98.5-102.2. He underwent lumbar puncture earlier which showed 1 WBC in each of tubes 1 and 4. He was started on Rocephin, vancomycin and acyclovir. He has been under treatment on an outpatient basis for possible herpangina and aphthous ulcers involving his mouth.  Past Medical History  Diagnosis Date  . Depression     History reviewed. No pertinent past surgical history.  No family history on file.  Social History:  reports that he has never smoked. He does not have any smokeless tobacco history on file. He reports that he drinks alcohol. He reports that he does not use illicit drugs.  Allergies  Allergen Reactions  . Penicillins Rash  . Prednisone Rash    MEDICATIONS:                                                                                                                     I have reviewed the patient's current medications.   ROS:  History obtained from the patient  General ROS: As noted in present illness Psychological ROS: negative for - behavioral disorder, hallucinations, memory difficulties, mood swings or suicidal ideation Ophthalmic ROS: negative for - blurry vision, double vision, eye pain or loss of vision ENT ROS:  negative for - epistaxis, nasal discharge, oral lesions, sore throat, tinnitus or vertigo Allergy and Immunology ROS: negative for - hives or itchy/watery eyes Hematological and Lymphatic ROS: negative for - bleeding problems, bruising or swollen lymph nodes Endocrine ROS: negative for - galactorrhea, hair pattern changes, polydipsia/polyuria or temperature intolerance Respiratory ROS: negative for - cough, hemoptysis, shortness of breath or wheezing Cardiovascular ROS: negative for - chest pain, dyspnea on exertion, edema or irregular heartbeat Gastrointestinal ROS: negative for - abdominal pain, diarrhea, hematemesis, nausea/vomiting or stool incontinence Genito-Urinary ROS: negative for - dysuria, hematuria, incontinence or urinary frequency/urgency Musculoskeletal ROS: As noted in present illness Neurological ROS: as noted in HPI Dermatological ROS: negative for rash and skin lesion changes   Blood pressure 132/74, pulse 83, temperature 99.6 F (37.6 C), temperature source Oral, resp. rate 18, height _0  (1.88 m), weight 95.255 kg (210 lb), SpO2 98.00%.   Neurologic Examination:                                                                                                      Mental Status: Alert, oriented, thought content appropriate.  Speech fluent without evidence of aphasia. Able to follow commands without difficulty. Cranial Nerves: II-Visual fields were normal. III/IV/VI-Pupils were equal and reacted. Extraocular movements were full and conjugate.    V/VII-no facial numbness and no facial weakness. VIII-normal. X-normal speech and symmetrical palatal movement. Motor: 5/5 bilaterally with normal tone and bulk Sensory: Normal throughout. Deep Tendon Reflexes: Trace to 1+ and symmetric. Plantars: Flexor bilaterally Cerebellar: Normal finger-to-nose testing.  No results found for this basename: cbc, bmp, coags, chol, tri, ldl, hga1c    Results for orders placed during the  hospital encounter of 08/24/14 (from the past 48 hour(s))  CBC WITH DIFFERENTIAL     Status: Abnormal   Collection Time    08/24/14  9:23 PM      Result Value Ref Range   WBC 11.3 (*) 4.0 - 10.5 K/uL   RBC 4.87  4.22 - 5.81 MIL/uL   Hemoglobin 15.1  13.0 - 17.0 g/dL   HCT 41.9  39.0 - 52.0 %   MCV 86.0  78.0 - 100.0 fL   MCH 31.0  26.0 - 34.0 pg   MCHC 36.0  30.0 - 36.0 g/dL   RDW 11.8  11.5 - 15.5 %   Platelets 325  150 - 400 K/uL   Neutrophils Relative % 73  43 - 77 %   Neutro Abs 8.3 (*) 1.7 - 7.7 K/uL   Lymphocytes Relative 16  12 - 46 %   Lymphs Abs 1.8  0.7 - 4.0 K/uL   Monocytes Relative 9  3 - 12 %   Monocytes Absolute 1.0  0.1 - 1.0 K/uL   Eosinophils Relative 2  0 - 5 %  Eosinophils Absolute 0.2  0.0 - 0.7 K/uL   Basophils Relative 0  0 - 1 %   Basophils Absolute 0.0  0.0 - 0.1 K/uL  COMPREHENSIVE METABOLIC PANEL     Status: Abnormal   Collection Time    08/24/14  9:23 PM      Result Value Ref Range   Sodium 140  137 - 147 mEq/L   Potassium 3.5 (*) 3.7 - 5.3 mEq/L   Chloride 100  96 - 112 mEq/L   CO2 26  19 - 32 mEq/L   Glucose, Bld 104 (*) 70 - 99 mg/dL   BUN 12  6 - 23 mg/dL   Creatinine, Ser 1.16  0.50 - 1.35 mg/dL   Calcium 9.1  8.4 - 10.5 mg/dL   Total Protein 7.7  6.0 - 8.3 g/dL   Albumin 3.5  3.5 - 5.2 g/dL   AST 15  0 - 37 U/L   ALT 18  0 - 53 U/L   Alkaline Phosphatase 62  39 - 117 U/L   Total Bilirubin 0.9  0.3 - 1.2 mg/dL   GFR calc non Af Amer 75 (*) >90 mL/min   GFR calc Af Amer 86 (*) >90 mL/min   Comment: (NOTE)     The eGFR has been calculated using the CKD EPI equation.     This calculation has not been validated in all clinical situations.     eGFR's persistently <90 mL/min signify possible Chronic Kidney     Disease.   Anion gap 14  5 - 15  PROTIME-INR     Status: Abnormal   Collection Time    08/24/14  9:23 PM      Result Value Ref Range   Prothrombin Time 15.4 (*) 11.6 - 15.2 seconds   INR 1.21  0.00 - 1.49  I-STAT CG4 LACTIC  ACID, ED     Status: None   Collection Time    08/24/14  9:47 PM      Result Value Ref Range   Lactic Acid, Venous 1.00  0.5 - 2.2 mmol/L  LACTIC ACID, PLASMA     Status: None   Collection Time    08/24/14  9:54 PM      Result Value Ref Range   Lactic Acid, Venous 1.0  0.5 - 2.2 mmol/L  CSF CELL COUNT WITH DIFFERENTIAL     Status: Abnormal (Preliminary result)   Collection Time    08/24/14 11:51 PM      Result Value Ref Range   Tube # 1     Color, CSF PINK (*) COLORLESS   Appearance, CSF HAZY (*) CLEAR   Supernatant COLORLESS     RBC Count, CSF 78 (*) 0 /cu mm   WBC, CSF 1  0 - 5 /cu mm   Segmented Neutrophils-CSF PENDING  0 - 6 %   Lymphs, CSF PENDING  40 - 80 %   Monocyte-Macrophage-Spinal Fluid PENDING  15 - 45 %   Eosinophils, CSF PENDING  0 - 1 %   Other Cells, CSF PENDING    GRAM STAIN     Status: None   Collection Time    08/24/14 11:51 PM      Result Value Ref Range   Specimen Description CSF     Special Requests NONE     Gram Stain       Value: WBC PRESENT, PREDOMINANTLY MONONUCLEAR     NO ORGANISMS SEEN     CYTOSPUN   Report  Status 08/25/2014 FINAL    GLUCOSE, CSF     Status: None   Collection Time    08/24/14 11:51 PM      Result Value Ref Range   Glucose, CSF 72  43 - 76 mg/dL  PROTEIN, CSF     Status: None   Collection Time    08/24/14 11:51 PM      Result Value Ref Range   Total  Protein, CSF 21  15 - 45 mg/dL  CRYPTOCOCCAL ANTIGEN, CSF     Status: None   Collection Time    08/24/14 11:51 PM      Result Value Ref Range   Crypto Ag NEGATIVE  NEGATIVE   Cryptococcal Ag Titer NOT INDICATED  NOT INDICATED  CSF CELL COUNT WITH DIFFERENTIAL     Status: Abnormal (Preliminary result)   Collection Time    08/24/14 11:52 PM      Result Value Ref Range   Tube # 4     Color, CSF COLORLESS  COLORLESS   Appearance, CSF CLEAR  CLEAR   Supernatant NOT INDICATED     RBC Count, CSF 1 (*) 0 /cu mm   WBC, CSF 1  0 - 5 /cu mm   Segmented Neutrophils-CSF PENDING   0 - 6 %   Lymphs, CSF PENDING  40 - 80 %   Monocyte-Macrophage-Spinal Fluid PENDING  15 - 45 %   Eosinophils, CSF PENDING  0 - 1 %   Other Cells, CSF PENDING      Ct Head Wo Contrast  08/24/2014   CLINICAL DATA:  Severe right side headache  EXAM: CT HEAD WITHOUT CONTRAST  TECHNIQUE: Contiguous axial images were obtained from the base of the skull through the vertex without intravenous contrast.  COMPARISON:  None  FINDINGS: No skull fracture is noted. Paranasal sinuses and mastoid air cells are unremarkable. No intracranial hemorrhage, mass effect or midline shift. No acute cortical infarction. No mass lesion is noted on this unenhanced scan. The gray and white-matter differentiation is preserved.  IMPRESSION: No acute intracranial abnormality.   Electronically Signed   By: Lahoma Crocker M.D.   On: 08/24/2014 22:58   Dg Chest Port 1 View  08/24/2014   CLINICAL DATA:  43 year old male with acute fever, right side head and neck pain. Initial encounter.  EXAM: PORTABLE CHEST - 1 VIEW  COMPARISON:  None.  FINDINGS: Portable AP semi upright view at at 2159 hrs. Mildly low lung volumes. Normal cardiac size and mediastinal contours. No pneumothorax, pulmonary edema, pleural effusion or confluent pulmonary opacity. No osseous abnormality identified.  IMPRESSION: Somewhat low lung volumes, otherwise no acute cardiopulmonary abnormality.   Electronically Signed   By: Lars Pinks M.D.   On: 08/24/2014 22:18   Assessment/Plan: 43 year old man presenting with headache neck pain and fever of unclear etiology. Patient has no acute focal nor generalized neurologic deficits clinically. CSF showed no signs of pleocytosis with 1 WBC in nature tubes 1 and 4. Meningitis is unlikely.  Recommendations: 1. Symptomatic management of headache and neck pain with parenteral analgesic medication as needed. 2. Trial of Robaxin 500 mg every 6 hours when necessary neck pain. 3. Sedimentation rate 4. No further neurodiagnostic  studies are indicated at this point. 5. No indication for treatment with acyclovir from a neurologic standpoint, as acute meningitis is unlikely.  C.R. Nicole Kindred, MD [facility] [phone]  08/25/2014, 1:15 AM

## 2014-08-26 DIAGNOSIS — Z79899 Other long term (current) drug therapy: Secondary | ICD-10-CM | POA: Diagnosis not present

## 2014-08-26 DIAGNOSIS — Z98818 Other dental procedure status: Secondary | ICD-10-CM

## 2014-08-26 DIAGNOSIS — R51 Headache: Secondary | ICD-10-CM | POA: Diagnosis present

## 2014-08-26 DIAGNOSIS — G08 Intracranial and intraspinal phlebitis and thrombophlebitis: Principal | ICD-10-CM

## 2014-08-26 DIAGNOSIS — F329 Major depressive disorder, single episode, unspecified: Secondary | ICD-10-CM | POA: Diagnosis present

## 2014-08-26 DIAGNOSIS — Z8619 Personal history of other infectious and parasitic diseases: Secondary | ICD-10-CM | POA: Diagnosis not present

## 2014-08-26 DIAGNOSIS — H70001 Acute mastoiditis without complications, right ear: Secondary | ICD-10-CM | POA: Diagnosis present

## 2014-08-26 DIAGNOSIS — H70009 Acute mastoiditis without complications, unspecified ear: Secondary | ICD-10-CM | POA: Diagnosis present

## 2014-08-26 DIAGNOSIS — I809 Phlebitis and thrombophlebitis of unspecified site: Secondary | ICD-10-CM

## 2014-08-26 DIAGNOSIS — R21 Rash and other nonspecific skin eruption: Secondary | ICD-10-CM | POA: Diagnosis present

## 2014-08-26 DIAGNOSIS — Z880 Allergy status to penicillin: Secondary | ICD-10-CM | POA: Diagnosis not present

## 2014-08-26 LAB — CBC WITH DIFFERENTIAL/PLATELET
BASOS ABS: 0 10*3/uL (ref 0.0–0.1)
Basophils Relative: 0 % (ref 0–1)
EOS ABS: 0.3 10*3/uL (ref 0.0–0.7)
Eosinophils Relative: 3 % (ref 0–5)
HCT: 41.2 % (ref 39.0–52.0)
Hemoglobin: 14.7 g/dL (ref 13.0–17.0)
LYMPHS ABS: 1.7 10*3/uL (ref 0.7–4.0)
LYMPHS PCT: 17 % (ref 12–46)
MCH: 30.2 pg (ref 26.0–34.0)
MCHC: 35.7 g/dL (ref 30.0–36.0)
MCV: 84.8 fL (ref 78.0–100.0)
Monocytes Absolute: 0.9 10*3/uL (ref 0.1–1.0)
Monocytes Relative: 9 % (ref 3–12)
NEUTROS PCT: 71 % (ref 43–77)
Neutro Abs: 6.7 10*3/uL (ref 1.7–7.7)
PLATELETS: 282 10*3/uL (ref 150–400)
RBC: 4.86 MIL/uL (ref 4.22–5.81)
RDW: 11.9 % (ref 11.5–15.5)
WBC: 9.5 10*3/uL (ref 4.0–10.5)

## 2014-08-26 LAB — HOMOCYSTEINE: Homocysteine: 11.6 umol/L (ref 4.0–15.4)

## 2014-08-26 LAB — COMPREHENSIVE METABOLIC PANEL
ALT: 15 U/L (ref 0–53)
AST: 15 U/L (ref 0–37)
Albumin: 3 g/dL — ABNORMAL LOW (ref 3.5–5.2)
Alkaline Phosphatase: 54 U/L (ref 39–117)
Anion gap: 12 (ref 5–15)
BILIRUBIN TOTAL: 0.7 mg/dL (ref 0.3–1.2)
BUN: 11 mg/dL (ref 6–23)
CO2: 22 meq/L (ref 19–32)
CREATININE: 1.07 mg/dL (ref 0.50–1.35)
Calcium: 9.2 mg/dL (ref 8.4–10.5)
Chloride: 102 mEq/L (ref 96–112)
GFR, EST NON AFRICAN AMERICAN: 82 mL/min — AB (ref >90)
GLUCOSE: 101 mg/dL — AB (ref 70–99)
Potassium: 4.2 mEq/L (ref 3.7–5.3)
Sodium: 136 mEq/L — ABNORMAL LOW (ref 137–147)
Total Protein: 7 g/dL (ref 6.0–8.3)

## 2014-08-26 LAB — C4 COMPLEMENT: Complement C4, Body Fluid: 28 mg/dL (ref 10–40)

## 2014-08-26 LAB — VDRL, CSF: VDRL Quant, CSF: NONREACTIVE

## 2014-08-26 LAB — URINE CULTURE
COLONY COUNT: NO GROWTH
CULTURE: NO GROWTH

## 2014-08-26 LAB — C3 COMPLEMENT: C3 Complement: 146 mg/dL (ref 90–180)

## 2014-08-26 NOTE — Progress Notes (Signed)
UR completed 

## 2014-08-26 NOTE — Progress Notes (Signed)
Nutrition Brief Note  Patient identified on the Malnutrition Screening Tool (MST) Report  Wt Readings from Last 15 Encounters:  08/26/14 214 lb 11.2 oz (97.387 kg)  08/24/14 215 lb (97.523 kg)  08/18/14 216 lb (97.977 kg)  08/17/14 216 lb 6 oz (98.147 kg)  05/02/14 216 lb (97.977 kg)  12/13/13 214 lb (97.07 kg)  12/05/13 216 lb (97.977 kg)   This is a 43 y.o. year old male with no significant past medical history of presenting with headache, fever, rash concerning for meningitis. Patient reports progressive severe right-sided headache over the past 2 weeks. Has seen his PCP about symptoms over the same time frame. Had some noted oral lesions within this time frame that was concerning for herpangina with aphthous ulcers per PCP note. was given prescription for Valtrex and Magic mouthwash with resolution of oral lesions.  Pt admitted with R transverse sinus thrombosis after dental procedure   MRI findings show signs of right transverse sinus thrombosis, possible septic given fevers. Awating blood cultures for further workup; currently receiving IV antibiotics.   Body mass index is 27.55 kg/(m^2). Patient meets criteria for overweight based on current BMI.   Current diet order is Heart Healthy, patient is consuming approximately 100% of meals at this time. Labs and medications reviewed.   No nutrition interventions warranted at this time. If nutrition issues arise, please consult RD.   Arkel Cartwright A. Mayford KnifeWilliams, RD, LDN Pager: [phone] After hours Pager: [phone]

## 2014-08-26 NOTE — Progress Notes (Signed)
NEURO HOSPITALIST PROGRESS NOTE   SUBJECTIVE:                                                                                                                        Feeling better this morning, no HA at this moment, looks comfortable. ID input appreciated. MRI brain acute R transverse sinus thrombosis. Right mastoid sinus effusion with air-fluid levels. MRI neck unrevealing. On ceftriaxone and vancomycin days # 2. Lovenox. CSF negative for infection.  OBJECTIVE:                                                                                                                           Vital signs in last 24 hours: Temp:  [98 F (36.7 C)-98.5 F (36.9 C)] 98.4 F (36.9 C) (10/24 0200) Pulse Rate:  [78-84] 84 (10/24 0200) Resp:  [16-18] 18 (10/24 0200) BP: (121-140)/(75-83) 125/76 mmHg (10/24 0200) SpO2:  [99 %-100 %] 99 % (10/24 0200) Weight:  [97.387 kg (214 lb 11.2 oz)] 97.387 kg (214 lb 11.2 oz) (10/24 0452)  Intake/Output from previous day:   Intake/Output this shift:   Nutritional status: Cardiac  Past Medical History  Diagnosis Date  . Depression    Physical exam: pleasant male in no apparent distress. Head: normocephalic. Neck: supple, no bruits, no JVD. Cardiac: no murmurs. Lungs: clear. Abdomen: soft, no tender, no mass. Extremities: no edema.  Neurologic Exam:  Mental Status:  Alert, oriented, thought content appropriate. Speech fluent without evidence of aphasia. Able to follow commands without difficulty.  Cranial Nerves:  II-Visual fields were normal.  III/IV/VI-Pupils were equal and reacted. Extraocular movements were full and conjugate.  V/VII-no facial numbness and no facial weakness.  VIII-normal.  X-normal speech and symmetrical palatal movement.  Motor: 5/5 bilaterally with normal tone and bulk  Sensory: Normal throughout.  Deep Tendon Reflexes: Trace to 1+ and symmetric.  Plantars: Flexor bilaterally   Cerebellar: Normal finger-to-nose testing. Gait: no tested.  Lab Results: No results found for this basename: cbc, bmp, coags, chol, tri, ldl, hga1c   Lipid Panel No results found for this basename: CHOL, TRIG, HDL, CHOLHDL, VLDL, LDLCALC,  in the last 72 hours  Studies/Results: Ct Head Wo Contrast  08/24/2014   CLINICAL DATA:  Severe right side headache  EXAM: CT HEAD WITHOUT CONTRAST  TECHNIQUE: Contiguous axial images were obtained from the base of the skull through the vertex without intravenous contrast.  COMPARISON:  None  FINDINGS: No skull fracture is noted. Paranasal sinuses and mastoid air cells are unremarkable. No intracranial hemorrhage, mass effect or midline shift. No acute cortical infarction. No mass lesion is noted on this unenhanced scan. The gray and white-matter differentiation is preserved.  IMPRESSION: No acute intracranial abnormality.   Electronically Signed   By: Natasha Mead M.D.   On: 08/24/2014 22:58   Mr Laqueta Jean ZO Contrast  08/25/2014   ADDENDUM REPORT: 08/25/2014 17:00  ADDENDUM: These results were called by telephone at the time of interpretation on 08/25/2014 at 4:45 Pm to Dr. Cyril Mourning , who verbally acknowledged these results.   Electronically Signed   By: Marlan Palau M.D.   On: 08/25/2014 17:00   08/25/2014   CLINICAL DATA:  Severe right-sided headache. Fever. Rule out meningitis  EXAM: MRI HEAD WITHOUT AND WITH CONTRAST  TECHNIQUE: Multiplanar, multiecho pulse sequences of the brain and surrounding structures were obtained without and with intravenous contrast.  CONTRAST:  20mL MULTIHANCE GADOBENATE DIMEGLUMINE 529 MG/ML IV SOLN  COMPARISON:  CT head 08/24/2014  FINDINGS: Ventricle size is normal. Cerebral volume normal. Craniocervical junction normal. Pituitary relatively small in size.  Negative for acute or chronic infarction. Cerebral white matter is normal. Negative for demyelinating disease. Brainstem and cerebellum are normal.  Negative for hemorrhage  or mass lesion.  Normal enhancement following contrast infusion  Right mastoid sinus effusion with air-fluid levels. This may be due to acute infection and could be a cause of right-sided headaches.  Following contrast infusion, there is a filling defect in the right transverse sinus extending down to the sigmoid sinus compatible with acute thrombosis. This is felt to be septic thrombophlebitis related to acute mastoiditis. No acute infarct is identified.  Paranasal sinuses are clear.  IMPRESSION: Thrombosis of the right transverse sinus which appears acute. This is felt to be septic thrombophlebitis related to acute mastoiditis, given the symptoms of headache and fever.  No acute infarct or abscess.  Electronically Signed: By: Marlan Palau M.D. On: 08/25/2014 16:11   Mr Cervical Spine W Wo Contrast  08/25/2014   CLINICAL DATA:  Progressive right-sided headache with right neck pain. Clinical concern for meningitis.  EXAM: MRI CERVICAL SPINE WITHOUT AND WITH CONTRAST  TECHNIQUE: Multiplanar and multiecho pulse sequences of the cervical spine, to include the craniocervical junction and cervicothoracic junction, were obtained according to standard protocol without and with intravenous contrast.  CONTRAST:  20mL MULTIHANCE GADOBENATE DIMEGLUMINE 529 MG/ML IV SOLN  COMPARISON:  None.  FINDINGS: Normal alignment of the cervical vertebral bodies. No significant bony findings. The cervical spinal cord is normal. No worrisome enhancement. No dural enhancement.  C2-3:  No significant findings.  C3-4: Shallow disc osteophyte complex on the left with mild left foraminal stenosis.  C4-5:  No significant findings.  C5-6:  No significant findings.  C6-7: Shallow disc osteophyte complex on the right with right foraminal stenosis possibly irritating the right C7 nerve root.  C7-T1:  No significant findings.  IMPRESSION: 1. Shallow disc osteophyte complex on the left with mild left foraminal stenosis at C3-4. 2. Shallow disc  osteophyte complex on the right with right foraminal stenosis at C6-7. 3. Normal MR appearance of the cervical spinal cord. No areas of abnormal contrast enhancement are identified.   Electronically Signed   By:  Loralie ChampagneMark  Gallerani M.D.   On: 08/25/2014 17:37   Dg Chest Port 1 View  08/24/2014   CLINICAL DATA:  43 year old male with acute fever, right side head and neck pain. Initial encounter.  EXAM: PORTABLE CHEST - 1 VIEW  COMPARISON:  None.  FINDINGS: Portable AP semi upright view at at 2159 hrs. Mildly low lung volumes. Normal cardiac size and mediastinal contours. No pneumothorax, pulmonary edema, pleural effusion or confluent pulmonary opacity. No osseous abnormality identified.  IMPRESSION: Somewhat low lung volumes, otherwise no acute cardiopulmonary abnormality.   Electronically Signed   By: Augusto GambleLee  Hall M.D.   On: 08/24/2014 22:18    MEDICATIONS                                                                                                                        Scheduled: . cefTRIAXone (ROCEPHIN)  IV  2 g Intravenous Q24H  . enoxaparin (LOVENOX) injection  1 mg/kg Subcutaneous Q12H  . metronidazole  500 mg Intravenous Q8H  . vancomycin  1,000 mg Intravenous Q8H    ASSESSMENT/PLAN:                                                                                                            43 y/o acute R transverse sinus thrombosis, presumably septic thrombophlebitis related to acute mastoiditis. Right mastoid sinus effusion with air-fluid levels. Doing better this morning. Continue antibiotics, anticoagulation, symptomatic HA treatment.' Will follow up.  Wyatt Portelasvaldo Camilo, MD [facility] [phone]  08/26/2014, 8:45 AM

## 2014-08-26 NOTE — Progress Notes (Signed)
INFECTIOUS DISEASE PROGRESS NOTE  ID: Jack Graves is a 43 y.o. male with  Active Problems:   Headache   Meningitis   Rash  Subjective: Denies dizziness, f/c, weakness. Headache better  Abtx:  Anti-infectives   Start     Dose/Rate Route Frequency Ordered Stop   08/25/14 1830  vancomycin (VANCOCIN) IVPB 1000 mg/200 mL premix     1,000 mg 200 mL/hr over 60 Minutes Intravenous Every 8 hours 08/25/14 1725     08/25/14 1730  cefTRIAXone (ROCEPHIN) 2 g in dextrose 5 % 50 mL IVPB     2 g 100 mL/hr over 30 Minutes Intravenous Every 24 hours 08/25/14 1716     08/25/14 1730  metroNIDAZOLE (FLAGYL) IVPB 500 mg     500 mg 100 mL/hr over 60 Minutes Intravenous Every 8 hours 08/25/14 1716     08/25/14 0800  vancomycin (VANCOCIN) IVPB 1000 mg/200 mL premix  Status:  Discontinued     1,000 mg 200 mL/hr over 60 Minutes Intravenous Every 8 hours 08/25/14 0133 08/25/14 1136   08/25/14 0600  acyclovir (ZOVIRAX) 850 mg in dextrose 5 % 150 mL IVPB  Status:  Discontinued     850 mg 167 mL/hr over 60 Minutes Intravenous 3 times per day 08/25/14 0130 08/25/14 1235   08/25/14 0145  cefTRIAXone (ROCEPHIN) 2 g in dextrose 5 % 50 mL IVPB  Status:  Discontinued     2 g 100 mL/hr over 30 Minutes Intravenous Every 12 hours 08/25/14 0130 08/25/14 1136   08/25/14 0000  cefTRIAXone (ROCEPHIN) 2 g in dextrose 5 % 50 mL IVPB     2 g 100 mL/hr over 30 Minutes Intravenous  Once 08/24/14 2347 08/25/14 0055   08/25/14 0000  vancomycin (VANCOCIN) 2,000 mg in sodium chloride 0.9 % 500 mL IVPB     2,000 mg 250 mL/hr over 120 Minutes Intravenous  Once 08/24/14 2347 08/25/14 0120   08/24/14 2200  acyclovir (ZOVIRAX) 820 mg in dextrose 5 % 150 mL IVPB     10 mg/kg  82.2 kg (Ideal) 166.4 mL/hr over 60 Minutes Intravenous  Once 08/24/14 2144 08/25/14 0117      Medications:  Scheduled: . cefTRIAXone (ROCEPHIN)  IV  2 g Intravenous Q24H  . enoxaparin (LOVENOX) injection  1 mg/kg Subcutaneous Q12H  .  metronidazole  500 mg Intravenous Q8H  . vancomycin  1,000 mg Intravenous Q8H    Objective: Vital signs in last 24 hours: Temp:  [98 F (36.7 C)-98.5 F (36.9 C)] 98.4 F (36.9 C) (10/24 0200) Pulse Rate:  [78-84] 84 (10/24 0200) Resp:  [16-18] 18 (10/24 0200) BP: (121-140)/(75-83) 125/76 mmHg (10/24 0200) SpO2:  [99 %-100 %] 99 % (10/24 0200) Weight:  [97.387 kg (214 lb 11.2 oz)] 97.387 kg (214 lb 11.2 oz) (10/24 0452)   General appearance: alert, cooperative and no distress Resp: clear to auscultation bilaterally Cardio: regular rate and rhythm GI: normal findings: bowel sounds normal and soft, non-tender  Lab Results  Recent Labs  08/25/14 0449 08/26/14 0528  WBC 8.6 9.5  HGB 13.8 14.7  HCT 38.4* 41.2  NA 139 136*  K 3.6* 4.2  CL 104 102  CO2 21 22  BUN 10 11  CREATININE 1.03 1.07   Liver Panel  Recent Labs  08/25/14 0449 08/26/14 0528  PROT 6.6 7.0  ALBUMIN 2.8* 3.0*  AST 13 15  ALT 15 15  ALKPHOS 56 54  BILITOT 0.6 0.7   Sedimentation Rate  Recent Labs  08/25/14 0449  ESRSEDRATE 49*   C-Reactive Protein  Recent Labs  08/25/14 0950  CRP 9.4*    Microbiology: Recent Results (from the past 240 hour(s))  CSF CULTURE     Status: None   Collection Time    08/24/14 11:51 PM      Result Value Ref Range Status   Specimen Description CSF   Final   Special Requests NONE   Final   Gram Stain     Final   Value: CYTOSPIN SLIDE WBC PRESENT, PREDOMINANTLY MONONUCLEAR     NO ORGANISMS SEEN     Performed at [facility]     Performed at [facility]   Culture PENDING   Incomplete   Report Status PENDING   Incomplete  GRAM STAIN     Status: None   Collection Time    08/24/14 11:51 PM      Result Value Ref Range Status   Specimen Description CSF   Final   Special Requests NONE   Final   Gram Stain     Final   Value: WBC PRESENT, PREDOMINANTLY MONONUCLEAR     NO ORGANISMS SEEN     CYTOSPUN   Report Status 08/25/2014 FINAL    Final    Studies/Results: Ct Head Wo Contrast  08/24/2014   CLINICAL DATA:  Severe right side headache  EXAM: CT HEAD WITHOUT CONTRAST  TECHNIQUE: Contiguous axial images were obtained from the base of the skull through the vertex without intravenous contrast.  COMPARISON:  None  FINDINGS: No skull fracture is noted. Paranasal sinuses and mastoid air cells are unremarkable. No intracranial hemorrhage, mass effect or midline shift. No acute cortical infarction. No mass lesion is noted on this unenhanced scan. The gray and white-matter differentiation is preserved.  IMPRESSION: No acute intracranial abnormality.   Electronically Signed   By: Natasha Mead M.D.   On: 08/24/2014 22:58   Mr Laqueta Jean ZO Contrast  08/25/2014   ADDENDUM REPORT: 08/25/2014 17:00  ADDENDUM: These results were called by telephone at the time of interpretation on 08/25/2014 at 4:45 Pm to Dr. Cyril Mourning , who verbally acknowledged these results.   Electronically Signed   By: Marlan Palau M.D.   On: 08/25/2014 17:00   08/25/2014   CLINICAL DATA:  Severe right-sided headache. Fever. Rule out meningitis  EXAM: MRI HEAD WITHOUT AND WITH CONTRAST  TECHNIQUE: Multiplanar, multiecho pulse sequences of the brain and surrounding structures were obtained without and with intravenous contrast.  CONTRAST:  20mL MULTIHANCE GADOBENATE DIMEGLUMINE 529 MG/ML IV SOLN  COMPARISON:  CT head 08/24/2014  FINDINGS: Ventricle size is normal. Cerebral volume normal. Craniocervical junction normal. Pituitary relatively small in size.  Negative for acute or chronic infarction. Cerebral white matter is normal. Negative for demyelinating disease. Brainstem and cerebellum are normal.  Negative for hemorrhage or mass lesion.  Normal enhancement following contrast infusion  Right mastoid sinus effusion with air-fluid levels. This may be due to acute infection and could be a cause of right-sided headaches.  Following contrast infusion, there is a filling defect in the  right transverse sinus extending down to the sigmoid sinus compatible with acute thrombosis. This is felt to be septic thrombophlebitis related to acute mastoiditis. No acute infarct is identified.  Paranasal sinuses are clear.  IMPRESSION: Thrombosis of the right transverse sinus which appears acute. This is felt to be septic thrombophlebitis related to acute mastoiditis, given the symptoms of headache and fever.  No acute infarct or abscess.  Electronically Signed: By: Marlan Palauharles  Clark M.D. On: 08/25/2014 16:11   Mr Cervical Spine W Wo Contrast  08/25/2014   CLINICAL DATA:  Progressive right-sided headache with right neck pain. Clinical concern for meningitis.  EXAM: MRI CERVICAL SPINE WITHOUT AND WITH CONTRAST  TECHNIQUE: Multiplanar and multiecho pulse sequences of the cervical spine, to include the craniocervical junction and cervicothoracic junction, were obtained according to standard protocol without and with intravenous contrast.  CONTRAST:  20mL MULTIHANCE GADOBENATE DIMEGLUMINE 529 MG/ML IV SOLN  COMPARISON:  None.  FINDINGS: Normal alignment of the cervical vertebral bodies. No significant bony findings. The cervical spinal cord is normal. No worrisome enhancement. No dural enhancement.  C2-3:  No significant findings.  C3-4: Shallow disc osteophyte complex on the left with mild left foraminal stenosis.  C4-5:  No significant findings.  C5-6:  No significant findings.  C6-7: Shallow disc osteophyte complex on the right with right foraminal stenosis possibly irritating the right C7 nerve root.  C7-T1:  No significant findings.  IMPRESSION: 1. Shallow disc osteophyte complex on the left with mild left foraminal stenosis at C3-4. 2. Shallow disc osteophyte complex on the right with right foraminal stenosis at C6-7. 3. Normal MR appearance of the cervical spinal cord. No areas of abnormal contrast enhancement are identified.   Electronically Signed   By: Loralie ChampagneMark  Gallerani M.D.   On: 08/25/2014 17:37   Dg  Chest Port 1 View  08/24/2014   CLINICAL DATA:  43 year old male with acute fever, right side head and neck pain. Initial encounter.  EXAM: PORTABLE CHEST - 1 VIEW  COMPARISON:  None.  FINDINGS: Portable AP semi upright view at at 2159 hrs. Mildly low lung volumes. Normal cardiac size and mediastinal contours. No pneumothorax, pulmonary edema, pleural effusion or confluent pulmonary opacity. No osseous abnormality identified.  IMPRESSION: Somewhat low lung volumes, otherwise no acute cardiopulmonary abnormality.   Electronically Signed   By: Augusto GambleLee  Hall M.D.   On: 08/24/2014 22:18     Assessment/Plan: R transverse sinus thrombosis after dental procedure MRI neck shows no infectious source BCx are pending CSF is ngtd  Total days of antibiotics: 2 vanco/ceftriaxone/flagyl Heparin  Would Continue his current anbx Await his BCx (septic thrombophlebitis is sometimes related to anaerobes which can be hard to grow).  Continue to watch, he is improving....         Johny SaxJeffrey Lela Gell Infectious Diseases (pager) [phone] [email or web address] 08/26/2014, 8:33 AM  LOS: 2 days

## 2014-08-26 NOTE — Progress Notes (Signed)
PROGRESS NOTE  Jack Graves ZOX:[id number] DOB: 05/09/1971 DOA: 08/24/2014 PCP: Leo GrosserPICKARD,WARREN TOM, MD  HPI: 43 y.o. year old male with no significant past medical history of presenting with headache, fever, rash concerning initially for meningitis. Patient reported progressive severe right-sided headache over the past 2 weeks, after having dental work. Has seen his PCP about symptoms over the same time frame. Had some noted oral lesions within this time frame that was concerning for herpangina with aphthous ulcers per PCP note.  Subjective/ 24 H Interval events - feeling a bit better this morning, still has a headache but better - MRI yesterday with acute mastoiditis/transverse sinus thrombosis/septic thrombophlebitis  Assessment/Plan: Acute mastoiditis with right transverse sinus thrombosis and concern for septic thrombophlebitis - started on Vancomycin/Ceftriazone/Metronidazole per ID, appreciate consult.  - blood cultures pending - continue Lovenox, will start Coumadin tomorrow if continues to improve - neurology following, appreciate input   Diet: regular  Fluids: none DVT Prophylaxis: Lovenox  Code Status: Full Family Communication: d/w wife bedside  Disposition Plan: inpatient, home when ready   Consultants:  ID  Neurology  Procedures:  None    Antibiotics Vancomycin 10/23 >> Ceftriaxone 10/23 >> Metronidazole 10/23 >>   Studies  Filed Vitals:   08/25/14 2100 08/26/14 0200 08/26/14 0452 08/26/14 0959  BP: 121/76 125/76  124/67  Pulse: 84 84  68  Temp: 98.5 F (36.9 C) 98.4 F (36.9 C)  98 F (36.7 C)  TempSrc: Oral Oral  Oral  Resp: 18 18  18   Height:      Weight:   97.387 kg (214 lb 11.2 oz)   SpO2: 99% 99%  99%    Intake/Output Summary (Last 24 hours) at 08/26/14 1014 Last data filed at 08/26/14 0900  Gross per 24 hour  Intake    240 ml  Output      0 ml  Net    240 ml   Filed Weights   08/24/14 2051 08/25/14 0237 08/26/14 0452    Weight: 95.255 kg (210 lb) 98.703 kg (217 lb 9.6 oz) 97.387 kg (214 lb 11.2 oz)   Exam:  General:  NAD  Cardiovascular: RRR  Respiratory: CTA biL  Abdomen: soft, non tender  MSK: non edema  Neuro: non focal  Data Reviewed: Basic Metabolic Panel:  Recent Labs Lab 08/24/14 2123 08/25/14 0449 08/26/14 0528  NA 140 139 136*  K 3.5* 3.6* 4.2  CL 100 104 102  CO2 26 21 22   GLUCOSE 104* 90 101*  BUN 12 10 11   CREATININE 1.16 1.03 1.07  CALCIUM 9.1 8.4 9.2  MG  --  1.9  --    Liver Function Tests:  Recent Labs Lab 08/24/14 2123 08/25/14 0449 08/26/14 0528  AST 15 13 15   ALT 18 15 15   ALKPHOS 62 56 54  BILITOT 0.9 0.6 0.7  PROT 7.7 6.6 7.0  ALBUMIN 3.5 2.8* 3.0*   CBC:  Recent Labs Lab 08/24/14 2123 08/25/14 0449 08/26/14 0528  WBC 11.3* 8.6 9.5  NEUTROABS 8.3* 5.8 6.7  HGB 15.1 13.8 14.7  HCT 41.9 38.4* 41.2  MCV 86.0 84.2 84.8  PLT 325 276 282    Recent Results (from the past 240 hour(s))  CSF CULTURE     Status: None   Collection Time    08/24/14 11:51 PM      Result Value Ref Range Status   Specimen Description CSF   Final   Special Requests NONE   Final   Gram Stain  Final   Value: CYTOSPIN SLIDE WBC PRESENT, PREDOMINANTLY MONONUCLEAR     NO ORGANISMS SEEN     [facility]     [facility]   Culture PENDING   Incomplete   Report Status PENDING   Incomplete  GRAM STAIN     Status: None   Collection Time    08/24/14 11:51 PM      Result Value Ref Range Status   Specimen Description CSF   Final   Special Requests NONE   Final   Gram Stain     Final   Value: WBC PRESENT, PREDOMINANTLY MONONUCLEAR     NO ORGANISMS SEEN     CYTOSPUN   Report Status 08/25/2014 FINAL   Final  URINE CULTURE     Status: None   Collection Time    08/25/14  2:20 AM      Result Value Ref Range Status   Specimen Description URINE, CLEAN CATCH   Final   Special Requests NONE   Final   Culture  Setup Time     Final    Value: 08/25/2014 03:00     [facility]   Colony Count     Final   Value: NO GROWTH     [facility]   Culture     Final   Value: NO GROWTH     [facility]formed at [facility]   Report Status 08/26/2014 FINAL   Final     Studies:  Mr Lodema PilotBrain W Wo Contrast  08/25/2014 Thrombosis of the right transverse sinus which appears acute. This is felt to be septic thrombophlebitis related to acute mastoiditis, given the symptoms of headache and fever.  No acute infarct or abscess.   Mr Cervical Spine W Wo Contrast 08/25/2014 1. Shallow disc osteophyte complex on the left with mild left foraminal stenosis at C3-4. 2. Shallow disc osteophyte complex on the right with right foraminal stenosis at C6-7. 3. Normal MR appearance of the cervical spinal cord. No areas of abnormal contrast enhancement are identified.   Scheduled Meds: . cefTRIAXone (ROCEPHIN)  IV  2 g Intravenous Q24H  . enoxaparin (LOVENOX) injection  1 mg/kg Subcutaneous Q12H  . metronidazole  500 mg Intravenous Q8H  . vancomycin  1,000 mg Intravenous Q8H   Continuous Infusions:   Active Problems:   Headache   Rash   Septic thrombophlebitis   Acute mastoiditis   Thrombosis transverse sinus   Time spent: 25  Pamella Pertostin Gherghe, MD [facility] Pager [phone]. If 7 PM - 7 AM, please contact night-coverage at [email or web address], password [email or web address] 08/26/2014, 10:14 AM  LOS: 2 days

## 2014-08-27 DIAGNOSIS — H70001 Acute mastoiditis without complications, right ear: Secondary | ICD-10-CM

## 2014-08-27 LAB — CBC WITH DIFFERENTIAL/PLATELET
Basophils Absolute: 0 10*3/uL (ref 0.0–0.1)
Basophils Relative: 0 % (ref 0–1)
EOS ABS: 0.3 10*3/uL (ref 0.0–0.7)
Eosinophils Relative: 3 % (ref 0–5)
HCT: 39.9 % (ref 39.0–52.0)
HEMOGLOBIN: 14.4 g/dL (ref 13.0–17.0)
Lymphocytes Relative: 15 % (ref 12–46)
Lymphs Abs: 1.4 10*3/uL (ref 0.7–4.0)
MCH: 30.3 pg (ref 26.0–34.0)
MCHC: 36.1 g/dL — ABNORMAL HIGH (ref 30.0–36.0)
MCV: 84 fL (ref 78.0–100.0)
MONOS PCT: 8 % (ref 3–12)
Monocytes Absolute: 0.8 10*3/uL (ref 0.1–1.0)
NEUTROS ABS: 6.9 10*3/uL (ref 1.7–7.7)
NEUTROS PCT: 74 % (ref 43–77)
PLATELETS: 306 10*3/uL (ref 150–400)
RBC: 4.75 MIL/uL (ref 4.22–5.81)
RDW: 11.9 % (ref 11.5–15.5)
WBC: 9.5 10*3/uL (ref 4.0–10.5)

## 2014-08-27 LAB — COMPREHENSIVE METABOLIC PANEL
ALT: 16 U/L (ref 0–53)
ANION GAP: 13 (ref 5–15)
AST: 13 U/L (ref 0–37)
Albumin: 3.1 g/dL — ABNORMAL LOW (ref 3.5–5.2)
Alkaline Phosphatase: 53 U/L (ref 39–117)
BUN: 13 mg/dL (ref 6–23)
CALCIUM: 9.3 mg/dL (ref 8.4–10.5)
CO2: 27 mEq/L (ref 19–32)
Chloride: 100 mEq/L (ref 96–112)
Creatinine, Ser: 1.1 mg/dL (ref 0.50–1.35)
GFR, EST NON AFRICAN AMERICAN: 79 mL/min — AB (ref >90)
GLUCOSE: 113 mg/dL — AB (ref 70–99)
Potassium: 4 mEq/L (ref 3.7–5.3)
SODIUM: 140 meq/L (ref 137–147)
Total Bilirubin: 0.6 mg/dL (ref 0.3–1.2)
Total Protein: 7.3 g/dL (ref 6.0–8.3)

## 2014-08-27 LAB — HERPES SIMPLEX VIRUS(HSV) DNA BY PCR
HSV 1 DNA: NOT DETECTED
HSV 2 DNA: NOT DETECTED

## 2014-08-27 LAB — HIV-1 RNA QUANT-NO REFLEX-BLD
HIV 1 RNA Quant: <20 copies/mL (ref <20)
HIV-1 RNA Quant, Log: <1.3 {Log} (ref <1.30)

## 2014-08-27 LAB — VANCOMYCIN, TROUGH: VANCOMYCIN TR: 6.9 ug/mL — AB (ref 10.0–20.0)

## 2014-08-27 MED ORDER — VANCOMYCIN HCL 10 G IV SOLR
1250.0000 mg | Freq: Three times a day (TID) | INTRAVENOUS | Status: DC
  Administered 2014-08-28 – 2014-08-29 (×5): 1250 mg via INTRAVENOUS
  Filled 2014-08-27 (×10): qty 1250

## 2014-08-27 NOTE — Progress Notes (Signed)
Patient resting in bed, denies any needs/pains at this time. Will continue to monitor.

## 2014-08-27 NOTE — Progress Notes (Addendum)
NEURO HOSPITALIST PROGRESS NOTE   SUBJECTIVE:                                                                                                                        Continuous improvement. No new neurological complains. Afebrile, normal white count. On ceftriaxone and vancomycin days # 3.  Lovenox.  CSF negative for infection.   OBJECTIVE:                                                                                                                           Vital signs in last 24 hours: Temp:  [97.8 F (36.6 C)-99.3 F (37.4 C)] 98.4 F (36.9 C) (10/25 0630) Pulse Rate:  [65-81] 65 (10/25 0630) Resp:  [18] 18 (10/25 0630) BP: (116-137)/(67-80) 120/78 mmHg (10/25 0630) SpO2:  [96 %-100 %] 99 % (10/25 0630) Weight:  [96.752 kg (213 lb 4.8 oz)] 96.752 kg (213 lb 4.8 oz) (10/25 0448)  Intake/Output from previous day: 10/24 0701 - 10/25 0700 In: 740 [P.O.:740] Out: 150 [Urine:150] Intake/Output this shift:   Nutritional status: Cardiac  Past Medical History  Diagnosis Date  . Depression   Physical exam: pleasant male in no apparent distress.  Head: normocephalic.  Neck: supple, no bruits, no JVD.  Cardiac: no murmurs.  Lungs: clear.  Abdomen: soft, no tender, no mass.  Extremities: no edema   Neurologic Exam:  Mental Status:  Alert, oriented, thought content appropriate. Speech fluent without evidence of aphasia. Able to follow commands without difficulty.  Cranial Nerves:  II-Visual fields were normal.  III/IV/VI-Pupils were equal and reacted. Extraocular movements were full and conjugate.  V/VII-no facial numbness and no facial weakness.  VIII-normal.  X-normal speech and symmetrical palatal movement.  Motor: 5/5 bilaterally with normal tone and bulk  Sensory: Normal throughout.  Deep Tendon Reflexes: Trace to 1+ and symmetric.  Plantars: Flexor bilaterally  Cerebellar: Normal finger-to-nose testing.  Gait: no  tested.   Lab Results: No results found for this basename: cbc, bmp, coags, chol, tri, ldl, hga1c   Lipid Panel No results found for this basename: CHOL, TRIG, HDL, CHOLHDL, VLDL, LDLCALC,  in the last 72 hours  Studies/Results: Mr Lodema PilotBrain W Wo Contrast  08/25/2014   ADDENDUM REPORT: 08/25/2014  17:00  ADDENDUM: These results were called by telephone at the time of interpretation on 08/25/2014 at 4:45 Pm to Dr. Cyril Mourning , who verbally acknowledged these results.   Electronically Signed   By: Marlan Palau M.D.   On: 08/25/2014 17:00   08/25/2014   CLINICAL DATA:  Severe right-sided headache. Fever. Rule out meningitis  EXAM: MRI HEAD WITHOUT AND WITH CONTRAST  TECHNIQUE: Multiplanar, multiecho pulse sequences of the brain and surrounding structures were obtained without and with intravenous contrast.  CONTRAST:  20mL MULTIHANCE GADOBENATE DIMEGLUMINE 529 MG/ML IV SOLN  COMPARISON:  CT head 08/24/2014  FINDINGS: Ventricle size is normal. Cerebral volume normal. Craniocervical junction normal. Pituitary relatively small in size.  Negative for acute or chronic infarction. Cerebral white matter is normal. Negative for demyelinating disease. Brainstem and cerebellum are normal.  Negative for hemorrhage or mass lesion.  Normal enhancement following contrast infusion  Right mastoid sinus effusion with air-fluid levels. This may be due to acute infection and could be a cause of right-sided headaches.  Following contrast infusion, there is a filling defect in the right transverse sinus extending down to the sigmoid sinus compatible with acute thrombosis. This is felt to be septic thrombophlebitis related to acute mastoiditis. No acute infarct is identified.  Paranasal sinuses are clear.  IMPRESSION: Thrombosis of the right transverse sinus which appears acute. This is felt to be septic thrombophlebitis related to acute mastoiditis, given the symptoms of headache and fever.  No acute infarct or abscess.   Electronically Signed: By: Marlan Palau M.D. On: 08/25/2014 16:11   Mr Cervical Spine W Wo Contrast  08/25/2014   CLINICAL DATA:  Progressive right-sided headache with right neck pain. Clinical concern for meningitis.  EXAM: MRI CERVICAL SPINE WITHOUT AND WITH CONTRAST  TECHNIQUE: Multiplanar and multiecho pulse sequences of the cervical spine, to include the craniocervical junction and cervicothoracic junction, were obtained according to standard protocol without and with intravenous contrast.  CONTRAST:  20mL MULTIHANCE GADOBENATE DIMEGLUMINE 529 MG/ML IV SOLN  COMPARISON:  None.  FINDINGS: Normal alignment of the cervical vertebral bodies. No significant bony findings. The cervical spinal cord is normal. No worrisome enhancement. No dural enhancement.  C2-3:  No significant findings.  C3-4: Shallow disc osteophyte complex on the left with mild left foraminal stenosis.  C4-5:  No significant findings.  C5-6:  No significant findings.  C6-7: Shallow disc osteophyte complex on the right with right foraminal stenosis possibly irritating the right C7 nerve root.  C7-T1:  No significant findings.  IMPRESSION: 1. Shallow disc osteophyte complex on the left with mild left foraminal stenosis at C3-4. 2. Shallow disc osteophyte complex on the right with right foraminal stenosis at C6-7. 3. Normal MR appearance of the cervical spinal cord. No areas of abnormal contrast enhancement are identified.   Electronically Signed   By: Loralie Champagne M.D.   On: 08/25/2014 17:37    MEDICATIONS  Scheduled: . cefTRIAXone (ROCEPHIN)  IV  2 g Intravenous Q24H  . enoxaparin (LOVENOX) injection  1 mg/kg Subcutaneous Q12H  . metronidazole  500 mg Intravenous Q8H  . vancomycin  1,000 mg Intravenous Q8H    ASSESSMENT/PLAN:                                                                                                            43 y/o acute R transverse sinus thrombosis, presumably septic thrombophlebitis related to acute mastoiditis. Right mastoid sinus effusion with air-fluid levels.  Doing better this morning.  Continue antibiotics, symptomatic HA treatment. I don't think he needs a chronic course of anticoagulation for septic transverse sinus thrombosis, as there is not solid data in this respect. Will need repeat neuroimaging in few weeks. Will continue to follow.  Wyatt Portelasvaldo Maaz Spiering, MD [facility] [phone]  08/27/2014, 9:04 AM

## 2014-08-27 NOTE — Progress Notes (Signed)
ANTIBIOTIC & ANTICOAGULATION CONSULT NOTE  Pharmacy Consult for Vancomycin; Lovenox Indication: septic transverse sinus thrombosis  Allergies  Allergen Reactions  . Penicillins Rash  . Prednisone Rash    Patient Measurements: Height: 6\' 2"  (188 cm) Weight: 213 lb 4.8 oz (96.752 kg) IBW/kg (Calculated) : 82.2 Vital Signs: Temp: 97.5 F (36.4 C) (10/25 1835) Temp Source: Oral (10/25 1835) BP: 128/82 mmHg (10/25 1835) Pulse Rate: 68 (10/25 1835) Intake/Output from previous day: 10/24 0701 - 10/25 0700 In: 740 [P.O.:740] Out: 150 [Urine:150] Intake/Output from this shift:    Labs:  Recent Labs  08/25/14 0449 08/26/14 0528 08/27/14 0455  WBC 8.6 9.5 9.5  HGB 13.8 14.7 14.4  PLT 276 282 306  CREATININE 1.03 1.07 1.10   Estimated Creatinine Clearance: 98.6 ml/min (by C-G formula based on Cr of 1.1).  Recent Labs  08/27/14 1820  VANCOTROUGH 6.9*     Microbiology: Recent Results (from the past 720 hour(s))  CULTURE, BLOOD (ROUTINE X 2)     Status: None   Collection Time    08/24/14  9:35 PM      Result Value Ref Range Status   Specimen Description BLOOD RIGHT ARM   Final   Special Requests BOTTLES DRAWN AEROBIC AND ANAEROBIC 5CC EACH   Final   Culture  Setup Time     Final   Value: 08/25/2014 00:42     Performed at [facility]   Culture     Final   Value:        BLOOD CULTURE RECEIVED NO GROWTH TO DATE CULTURE WILL BE HELD FOR 5 DAYS BEFORE ISSUING A FINAL NEGATIVE REPORT     Performed at [facility]   Report Status PENDING   Incomplete  CULTURE, BLOOD (ROUTINE X 2)     Status: None   Collection Time    08/24/14  9:54 PM      Result Value Ref Range Status   Specimen Description BLOOD LEFT ARM   Final   Special Requests BOTTLES DRAWN AEROBIC AND ANAEROBIC 10CC EACH   Final   Culture  Setup Time     Final   Value: 08/25/2014 00:42     Performed at [facility]   Culture     Final   Value:        BLOOD CULTURE RECEIVED NO GROWTH  TO DATE CULTURE WILL BE HELD FOR 5 DAYS BEFORE ISSUING A FINAL NEGATIVE REPORT     Performed at [facility]   Report Status PENDING   Incomplete  CSF CULTURE     Status: None   Collection Time    08/24/14 11:51 PM      Result Value Ref Range Status   Specimen Description CSF   Final   Special Requests NONE   Final   Gram Stain     Final   Value: CYTOSPIN SLIDE WBC PRESENT, PREDOMINANTLY MONONUCLEAR     NO ORGANISMS SEEN     Performed at [facility]     Performed at [facility]   Culture     Final   Value: NO GROWTH 2 DAYS     Performed at [facility]   Report Status PENDING   Incomplete  GRAM STAIN     Status: None   Collection Time    08/24/14 11:51 PM      Result Value Ref Range Status   Specimen Description CSF   Final   Special Requests NONE  Final   Gram Stain     Final   Value: WBC PRESENT, PREDOMINANTLY MONONUCLEAR     NO ORGANISMS SEEN     CYTOSPUN   Report Status 08/25/2014 FINAL   Final  URINE CULTURE     Status: None   Collection Time    08/25/14  2:20 AM      Result Value Ref Range Status   Specimen Description URINE, CLEAN CATCH   Final   Special Requests NONE   Final   Culture  Setup Time     Final   Value: 08/25/2014 03:00     [facility]formed [facility]   Colony Count     Final   Value: NO GROWTH     [facility]   Culture     Final   Value: NO GROWTH     [facility]   Report Status 08/26/2014 FINAL   Final  CULTURE, BLOOD (ROUTINE X 2)     Status: None   Collection Time    08/25/14  6:53 PM      Result Value Ref Range Status   Specimen Description BLOOD RIGHT HAND   Final   Special Requests BOTTLES DRAWN AEROBIC ONLY 6CC   Final   Culture  Setup Time     Final   Value: 08/26/2014 01:11     [facility]   Culture     Final   Value:        BLOOD CULTURE RECEIVED NO GROWTH TO DATE CULTURE WILL BE HELD FOR 5 DAYS BEFORE ISSUING A FINAL NEGATIVE  REPORT     [facility]   Report Status PENDING   Incomplete  CULTURE, BLOOD (ROUTINE X 2)     Status: None   Collection Time    08/25/14  7:10 PM      Result Value Ref Range Status   Specimen Description BLOOD LEFT ARM   Final   Special Requests BOTTLES DRAWN AEROBIC ONLY 2CC   Final   Culture  Setup Time     Final   Value: 08/26/2014 01:11     [facility]   Culture     Final   Value:        BLOOD CULTURE RECEIVED NO GROWTH TO DATE CULTURE WILL BE HELD FOR 5 DAYS BEFORE ISSUING A FINAL NEGATIVE REPORT     [facility]   Report Status PENDING   Incomplete    Medical History: Past Medical History  Diagnosis Date  . Depression     Medications:  Anti-infectives   Start     Dose/Rate Route Frequency Ordered Stop   08/28/14 0230  vancomycin (VANCOCIN) 1,250 mg in sodium chloride 0.9 % 250 mL IVPB     1,250 mg 166.7 mL/hr over 90 Minutes Intravenous Every 8 hours 08/27/14 1919     08/25/14 1830  vancomycin (VANCOCIN) IVPB 1000 mg/200 mL premix  Status:  Discontinued     1,000 mg 200 mL/hr over 60 Minutes Intravenous Every 8 hours 08/25/14 1725 08/27/14 1919   08/25/14 1730  cefTRIAXone (ROCEPHIN) 2 g in dextrose 5 % 50 mL IVPB     2 g 100 mL/hr over 30 Minutes Intravenous Every 24 hours 08/25/14 1716     08/25/14 1730  metroNIDAZOLE (FLAGYL) IVPB 500 mg     500 mg 100 mL/hr over 60 Minutes Intravenous Every 8 hours  08/25/14 1716     08/25/14 0800  vancomycin (VANCOCIN) IVPB 1000 mg/200 mL premix  Status:  Discontinued     1,000 mg 200 mL/hr over 60 Minutes Intravenous Every 8 hours 08/25/14 0133 08/25/14 1136   08/25/14 0600  acyclovir (ZOVIRAX) 850 mg in dextrose 5 % 150 mL IVPB  Status:  Discontinued     850 mg 167 mL/hr over 60 Minutes Intravenous 3 times per day 08/25/14 0130 08/25/14 1235   08/25/14 0145  cefTRIAXone (ROCEPHIN) 2 g in dextrose 5 % 50 mL IVPB  Status:  Discontinued     2 g 100 mL/hr over 30  Minutes Intravenous Every 12 hours 08/25/14 0130 08/25/14 1136   08/25/14 0000  cefTRIAXone (ROCEPHIN) 2 g in dextrose 5 % 50 mL IVPB     2 g 100 mL/hr over 30 Minutes Intravenous  Once 08/24/14 2347 08/25/14 0055   08/25/14 0000  vancomycin (VANCOCIN) 2,000 mg in sodium chloride 0.9 % 500 mL IVPB     2,000 mg 250 mL/hr over 120 Minutes Intravenous  Once 08/24/14 2347 08/25/14 0120   08/24/14 2200  acyclovir (ZOVIRAX) 820 mg in dextrose 5 % 150 mL IVPB     10 mg/kg  82.2 kg (Ideal) 166.4 mL/hr over 60 Minutes Intravenous  Once 08/24/14 2144 08/25/14 0117     Assessment: 43 year old male admitted with 2 wk history of headache, neck pain, and intermittent fevers started on vancomycin earlier today but discontinued as unremarkable CSF now re-ordered by Infectious Disease service for 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 Blood cultures have been sent. Patient received 2g of vancomycin at 0055 AM this morning and another 1g dose at 1027 AM. WBC is within normal limits. Patient is afebrile. SCr is 1.03 with estimated CrCl >100 mL/min.   Note in chart that patient may have had possible reaction with 2gm loading dose. No notes regarding intolerance of 2nd 1g dose.  Possible red man's syndrome which can be prevented by slowing the infusion.  Vanc trough 6.9 mg/L  Lovenox for anticoagulation of septic transverse thrombosis.  Goal of Therapy:  Vancomycin trough level 15-20 mcg/ml Lovenox - anti Xa levels as needed with goal of 0.6-1 units/mL  Plan:  1. Increase vanc to 1250 mg IV q8 hours 2. Monitor renal function, culture results, and clinical status. 3. Recheck VT @ Css 4. Cont lovenox 100mg  (1mg /kg) SQ q12h. Thanks for allowing pharmacy to be a part of this patient's care.  Talbert CageLora Fanny Agan, PharmD Clinical Pharmacist, [phone] 08/27/2014,7:20 PM

## 2014-08-27 NOTE — Progress Notes (Signed)
PROGRESS NOTE  Jack Graves ZOX:[id number] DOB: 16-Mar-1971 DOA: 08/24/2014 PCP: [name], MD  HPI: 43 y.o. year old male with no significant past medical history of presenting with headache, fever, rash concerning initially for meningitis. Patient reported progressive severe right-sided headache over the past 2 weeks, after having dental work. Has seen his PCP about symptoms over the same time frame. Had some noted oral lesions within this time frame that was concerning for herpangina with aphthous ulcers per PCP note.  Subjective/ 24 H Interval events - headache continues to improve today, has not needed morphine since yesterday  Assessment/Plan:  Acute mastoiditis with right transverse sinus thrombosis and concern for septic thrombophlebitis - started on Vancomycin/Ceftriazone/Metronidazole per ID, appreciate consult.  - blood cultures pending - continue Lovenox, will discuss with Hematology whether can use a NOAC - neurology following, appreciate input   Diet: regular  Fluids: none DVT Prophylaxis: Lovenox  Code Status: Full Family Communication: none this morning Disposition Plan: inpatient, home when ready   Consultants:  ID  Neurology  Procedures:  None    Antibiotics Vancomycin 10/23 >> Ceftriaxone 10/23 >> Metronidazole 10/23 >>   Studies  Filed Vitals:   08/26/14 2233 08/27/14 0225 08/27/14 0448 08/27/14 0630  BP: 126/80 116/73  120/78  Pulse: 80 81  65  Temp: 99.1 F (37.3 C) 99.3 F (37.4 C)  98.4 F (36.9 C)  TempSrc: Oral Oral  Oral  Resp: 18 18  18   Height:      Weight:   96.752 kg (213 lb 4.8 oz)   SpO2: 96% 97%  99%    Intake/Output Summary (Last 24 hours) at 08/27/14 0856 Last data filed at 08/27/14 0226  Gross per 24 hour  Intake    740 ml  Output    150 ml  Net    590 ml   Filed Weights   08/25/14 0237 08/26/14 0452 08/27/14 0448  Weight: 98.703 kg (217 lb 9.6 oz) 97.387 kg (214 lb 11.2 oz) 96.752 kg (213 lb  4.8 oz)   Exam:  General:  NAD  Cardiovascular: RRR  Respiratory: CTA biL  Abdomen: soft, non tender  MSK: non edema  Neuro: non focal  Data Reviewed: Basic Metabolic Panel:  Recent Labs Lab 08/24/14 2123 08/25/14 0449 08/26/14 0528 08/27/14 0455  NA 140 139 136* 140  K 3.5* 3.6* 4.2 4.0  CL 100 104 102 100  CO2 26 21 22 27   GLUCOSE 104* 90 101* 113*  BUN 12 10 11 13   CREATININE 1.16 1.03 1.07 1.10  CALCIUM 9.1 8.4 9.2 9.3  MG  --  1.9  --   --    Liver Function Tests:  Recent Labs Lab 08/24/14 2123 08/25/14 0449 08/26/14 0528 08/27/14 0455  AST 15 13 15 13   ALT 18 15 15 16   ALKPHOS 62 56 54 53  BILITOT 0.9 0.6 0.7 0.6  PROT 7.7 6.6 7.0 7.3  ALBUMIN 3.5 2.8* 3.0* 3.1*   CBC:  Recent Labs Lab 08/24/14 2123 08/25/14 0449 08/26/14 0528 08/27/14 0455  WBC 11.3* 8.6 9.5 9.5  NEUTROABS 8.3* 5.8 6.7 6.9  HGB 15.1 13.8 14.7 14.4  HCT 41.9 38.4* 41.2 39.9  MCV 86.0 84.2 84.8 84.0  PLT 325 276 282 306    Recent Results (from the past 240 hour(s))  CULTURE, BLOOD (ROUTINE X 2)     Status: None   Collection Time    08/24/14  9:35 PM      Result Value  Ref Range Status   Specimen Description BLOOD RIGHT ARM   Final   Special Requests BOTTLES DRAWN AEROBIC AND ANAEROBIC [facility]5CC EACH   Final   Culture  Setup Time     Final   Value: 08/25/2014 00:42     Performed at [facility]   Culture     Final   Value:        BLOOD CULTURE RECEIVED NO GROWTH TO DATE CULTURE WILL BE HELD FOR 5 DAYS BEFORE ISSUING A FINAL NEGATIVE REPORT     [facility]formed at [facility]   Report Status PENDING   Incomplete  CULTURE, BLOOD (ROUTINE X 2)     Status: None   Collection Time    08/24/14  9:54 PM      Result Value Ref Range Status   Specimen Description BLOOD LEFT ARM   Final   Special Requests BOTTLES DRAWN AEROBIC AND ANAEROBIC 10CC EACH   Final   Culture  Setup Time     Final   Value: 08/25/2014 00:42     [facility]formed at [facility]   Culture      Final   Value:        BLOOD CULTURE RECEIVED NO GROWTH TO DATE CULTURE WILL BE HELD FOR 5 DAYS BEFORE ISSUING A FINAL NEGATIVE REPORT     Performed at [facility]   Report Status PENDING   Incomplete  CSF CULTURE     Status: None   Collection Time    08/24/14 11:51 PM      Result Value Ref Range Status   Specimen Description CSF   Final   Special Requests NONE   Final   Gram Stain     Final   Value: CYTOSPIN SLIDE WBC PRESENT, PREDOMINANTLY MONONUCLEAR     NO ORGANISMS SEEN     [facility]     Performed at [facility]   Culture     Final   Value: NO GROWTH 1 DAY     [facility]formed at [facility]   Report Status PENDING   Incomplete  GRAM STAIN     Status: None   Collection Time    08/24/14 11:51 PM      Result Value Ref Range Status   Specimen Description CSF   Final   Special Requests NONE   Final   Gram Stain     Final   Value: WBC PRESENT, PREDOMINANTLY MONONUCLEAR     NO ORGANISMS SEEN     CYTOSPUN   Report Status 08/25/2014 FINAL   Final  URINE CULTURE     Status: None   Collection Time    08/25/14  2:20 AM      Result Value Ref Range Status   Specimen Description URINE, CLEAN CATCH   Final   Special Requests NONE   Final   Culture  Setup Time     Final   Value: 08/25/2014 03:00     [facility]formed at [facility]   Colony Count     Final   Value: NO GROWTH     [facility]formed at [facility]   Culture     Final   Value: NO GROWTH     [facility]formed at [facility]   Report Status 08/26/2014 FINAL   Final     Studies:  Mr Lodema PilotBrain W Wo Contrast  08/25/2014 Thrombosis of the right transverse sinus which appears acute. This is felt to  be septic thrombophlebitis related to acute mastoiditis, given the symptoms of headache and fever.  No acute infarct or abscess.   Mr Cervical Spine W Wo Contrast 08/25/2014 1. Shallow disc osteophyte complex on the left with mild left foraminal stenosis at C3-4. 2. Shallow disc  osteophyte complex on the right with right foraminal stenosis at C6-7. 3. Normal MR appearance of the cervical spinal cord. No areas of abnormal contrast enhancement are identified.   Scheduled Meds: . cefTRIAXone (ROCEPHIN)  IV  2 g Intravenous Q24H  . enoxaparin (LOVENOX) injection  1 mg/kg Subcutaneous Q12H  . metronidazole  500 mg Intravenous Q8H  . vancomycin  1,000 mg Intravenous Q8H   Continuous Infusions:   Active Problems:   Headache   Rash   Septic thrombophlebitis   Acute mastoiditis   Thrombosis transverse sinus   Time spent: 15  Pamella Pertostin Koreen Lizaola, MD [facility] Pager [phone]. If 7 PM - 7 AM, please contact night-coverage at [email or web address], password [email or web address] 08/27/2014, 8:56 AM  LOS: 3 days

## 2014-08-28 ENCOUNTER — Telehealth: Payer: Self-pay | Admitting: Hematology and Oncology

## 2014-08-28 DIAGNOSIS — R51 Headache: Secondary | ICD-10-CM

## 2014-08-28 DIAGNOSIS — H70009 Acute mastoiditis without complications, unspecified ear: Secondary | ICD-10-CM

## 2014-08-28 LAB — PROTEIN S, TOTAL: Protein S Ag, Total: 104 % (ref 60–150)

## 2014-08-28 LAB — PROTEIN C, TOTAL: Protein C, Total: 57 % — ABNORMAL LOW (ref 72–160)

## 2014-08-28 LAB — CSF CULTURE W GRAM STAIN: Culture: NO GROWTH

## 2014-08-28 LAB — LUPUS ANTICOAGULANT PANEL
DRVVT: 31.7 s
Lupus Anticoagulant: NOT DETECTED
PTT Lupus Anticoagulant: 35.6 s (ref 28.0–43.0)

## 2014-08-28 LAB — CBC WITH DIFFERENTIAL/PLATELET
BASOS PCT: 0 % (ref 0–1)
Basophils Absolute: 0 10*3/uL (ref 0.0–0.1)
EOS ABS: 0.3 10*3/uL (ref 0.0–0.7)
EOS PCT: 4 % (ref 0–5)
HCT: 40.3 % (ref 39.0–52.0)
HEMOGLOBIN: 14.8 g/dL (ref 13.0–17.0)
LYMPHS ABS: 2 10*3/uL (ref 0.7–4.0)
Lymphocytes Relative: 23 % (ref 12–46)
MCH: 30.8 pg (ref 26.0–34.0)
MCHC: 36.7 g/dL — AB (ref 30.0–36.0)
MCV: 83.8 fL (ref 78.0–100.0)
MONO ABS: 0.7 10*3/uL (ref 0.1–1.0)
Monocytes Relative: 8 % (ref 3–12)
Neutro Abs: 5.5 10*3/uL (ref 1.7–7.7)
Neutrophils Relative %: 64 % (ref 43–77)
Platelets: 318 10*3/uL (ref 150–400)
RBC: 4.81 MIL/uL (ref 4.22–5.81)
RDW: 11.9 % (ref 11.5–15.5)
WBC: 8.5 10*3/uL (ref 4.0–10.5)

## 2014-08-28 LAB — B. BURGDORFI ANTIBODIES: B burgdorferi Ab IgG+IgM: 0.61 {ISR}

## 2014-08-28 LAB — COMPREHENSIVE METABOLIC PANEL WITH GFR
ALT: 39 U/L (ref 0–53)
AST: 40 U/L — ABNORMAL HIGH (ref 0–37)
Albumin: 3.1 g/dL — ABNORMAL LOW (ref 3.5–5.2)
Alkaline Phosphatase: 55 U/L (ref 39–117)
Anion gap: 10 (ref 5–15)
BUN: 12 mg/dL (ref 6–23)
CO2: 28 meq/L (ref 19–32)
Calcium: 9.1 mg/dL (ref 8.4–10.5)
Chloride: 101 meq/L (ref 96–112)
Creatinine, Ser: 1.08 mg/dL (ref 0.50–1.35)
GFR calc Af Amer: >90 mL/min
GFR calc non Af Amer: 81 mL/min — ABNORMAL LOW
Glucose, Bld: 88 mg/dL (ref 70–99)
Potassium: 3.7 meq/L (ref 3.7–5.3)
Sodium: 139 meq/L (ref 137–147)
Total Bilirubin: 0.5 mg/dL (ref 0.3–1.2)
Total Protein: 7.1 g/dL (ref 6.0–8.3)

## 2014-08-28 LAB — CARDIOLIPIN ANTIBODIES, IGM+IGG
Anticardiolipin IgG: 20 GPL U/mL (ref <23)
Anticardiolipin IgM: 4 MPL U/mL — ABNORMAL LOW (ref <11)

## 2014-08-28 LAB — ANA: Anti Nuclear Antibody(ANA): NEGATIVE

## 2014-08-28 MED ORDER — RIVAROXABAN 15 MG PO TABS
15.0000 mg | ORAL_TABLET | Freq: Two times a day (BID) | ORAL | Status: DC
  Administered 2014-08-28 – 2014-08-29 (×2): 15 mg via ORAL
  Filled 2014-08-28 (×2): qty 1

## 2014-08-28 MED ORDER — RIVAROXABAN 20 MG PO TABS: 20.0000 mg | ORAL_TABLET | Freq: Every day | ORAL | Status: DC

## 2014-08-28 MED ORDER — RIVAROXABAN 15 MG PO TABS: 15.0000 mg | ORAL_TABLET | Freq: Two times a day (BID) | ORAL | Status: DC

## 2014-08-28 NOTE — Progress Notes (Signed)
NEURO HOSPITALIST PROGRESS NOTE   SUBJECTIVE:                                                                                                                        Continuous improvement. Notes decrease in headache severity. No visual deficits. No focal motor or sensory changes.  CSF negative for infection.   OBJECTIVE:                                                                                                                           Vital signs in last 24 hours: Temp:  [97.5 F (36.4 C)-98.6 F (37 C)] 97.6 F (36.4 C) (10/26 0937) Pulse Rate:  [65-89] 72 (10/26 0937) Resp:  [16-18] 16 (10/26 0937) BP: (113-138)/(63-85) 116/70 mmHg (10/26 0937) SpO2:  [98 %-100 %] 98 % (10/26 0937) Weight:  [95.437 kg (210 lb 6.4 oz)] 95.437 kg (210 lb 6.4 oz) (10/26 0500)  Intake/Output from previous day: 10/25 0701 - 10/26 0700 In: -  Out: 500 [Urine:500] Intake/Output this shift: Total I/O In: 360 [P.O.:360] Out: -  Nutritional status: Cardiac  Past Medical History  Diagnosis Date  . Depression   Physical exam: pleasant male in no apparent distress.  Head: normocephalic.  Neck: supple, no bruits, no JVD.  Cardiac: no murmurs.  Lungs: clear.  Abdomen: soft, no tender, no mass.  Extremities: no edema   Neurologic Exam:  Mental Status:  Alert, oriented, thought content appropriate. Speech fluent without evidence of aphasia. Able to follow commands without difficulty.  Cranial Nerves:  II-Visual fields were normal.  III/IV/VI-Pupils were equal and reacted. Extraocular movements were full and conjugate.  V/VII-no facial numbness and no facial weakness.  VIII-normal.  X-normal speech and symmetrical palatal movement.  Motor: 5/5 bilaterally with normal tone and bulk  Sensory: Normal throughout.  Deep Tendon Reflexes: Trace to 1+ and symmetric.  Plantars: Flexor bilaterally  Cerebellar: Normal finger-to-nose testing.  Gait: no  tested.   Lab Results: No components found with this basename: cbc,  bmp,  coags,  chol,  tri,  ldl,  hga1c   Lipid Panel No results found for this basename: CHOL, TRIG, HDL, CHOLHDL, VLDL, LDLCALC,  in the last 72 hours  Studies/Results: No results found.  MEDICATIONS                                                                                                                        Scheduled: . cefTRIAXone (ROCEPHIN)  IV  2 g Intravenous Q24H  . enoxaparin (LOVENOX) injection  1 mg/kg Subcutaneous Q12H  . metronidazole  500 mg Intravenous Q8H  . vancomycin  1,250 mg Intravenous Q8H    ASSESSMENT/PLAN:                                                                                                           43 y/o acute R transverse sinus thrombosis, presumably septic thrombophlebitis related to acute mastoiditis. Right mastoid sinus effusion with air-fluid levels.  Doing better this morning.  Continue antibiotics, symptomatic HA treatment.Would hold off on anticoagulation at this point and just treat the infection. Suggest starting topamax 25mg  nightly to treat headache and also lower ICP Will need repeat neuroimaging in few weeks. Will continue to follow.  Elspeth Choeter Ry Moody, DO [facility] [phone]  If 7pm- 7am, please page neurology on call as listed in AMION.   08/28/2014, 11:52 AM

## 2014-08-28 NOTE — Progress Notes (Signed)
[facility] chose [facility]; Jeri ModenaPam Gweneth Fredlund RN with [facility] made aware and is to see the patient todayAlexis Goodell; B Kloe Oates RN,BSN,MHA [phone]

## 2014-08-28 NOTE — Progress Notes (Signed)
PROGRESS NOTE  Jack Graves DOB: [birth year] DOA: 08/24/2014 PCP: [name], MD  HPI: 43 y.o. year old male with no significant past medical history of presenting with headache, fever, rash concerning initially for meningitis. Patient reported progressive severe right-sided headache over the past 2 weeks, after having dental work. Has seen his PCP about symptoms over the same time frame. Had some noted oral lesions within this time frame that was concerning for herpangina with aphthous ulcers per PCP note.  Subjective/ 24 H Interval events - feeling much better today, asking about timing to go home   Assessment/Plan:  Acute mastoiditis with right transverse sinus thrombosis and concern for septic thrombophlebitis - started on Vancomycin/Ceftriazone/Metronidazole per ID, appreciate consult, PICC Line today, plan for 6 week of antibiotics. - blood cultures negative so far  - discussed with hematology, will switch to Xarelto, have called Hematology and has a follow appointment next week with [name].  - neurology following, appreciate input   Diet: regular  Fluids: none DVT Prophylaxis: Lovenox  Code Status: Full Family Communication: d/w wife Disposition Plan: inpatient, home when ready   Consultants:  ID  Neurology  Procedures:  None    Antibiotics Vancomycin 10/23 >> Ceftriaxone 10/23 >> Metronidazole 10/23 >>  Studies  Filed Vitals:   08/28/14 0500 08/28/14 0555 08/28/14 0937 08/28/14 1418  BP:  126/83 116/70 126/76  Pulse:  69 72 76  Temp:  98.1 F (36.7 C) 97.6 F (36.4 C) 97.6 F (36.4 C)  TempSrc:  Oral Oral Oral  Resp:  16 16 18   Height:      Weight: 95.437 kg (210 lb 6.4 oz)     SpO2:  100% 98% 100%    Intake/Output Summary (Last 24 hours) at 08/28/14 1553 Last data filed at 08/28/14 1100  Gross per 24 hour  Intake    760 ml  Output    500 ml  Net    260 ml   Filed Weights   08/26/14 0452 08/27/14 0448  08/28/14 0500  Weight: 97.387 kg (214 lb 11.2 oz) 96.752 kg (213 lb 4.8 oz) 95.437 kg (210 lb 6.4 oz)   Exam:  General:  NAD  Cardiovascular: RRR  Respiratory: CTA biL  Abdomen: soft, non tender  MSK: non edema  Neuro: non focal  Data Reviewed: Basic Metabolic Panel:  Recent Labs Lab 08/24/14 2123 08/25/14 0449 08/26/14 0528 08/27/14 0455 08/28/14 0550  NA 140 139 136* 140 139  K 3.5* 3.6* 4.2 4.0 3.7  CL 100 104 102 100 101  CO2 26 21 22 27 28   GLUCOSE 104* 90 101* 113* 88  BUN 12 10 11 13 12   CREATININE 1.16 1.03 1.07 1.10 1.08  CALCIUM 9.1 8.4 9.2 9.3 9.1  MG  --  1.9  --   --   --    Liver Function Tests:  Recent Labs Lab 08/24/14 2123 08/25/14 0449 08/26/14 0528 08/27/14 0455 08/28/14 0550  AST 15 13 15 13  40*  ALT 18 15 15 16  39  ALKPHOS 62 56 54 53 55  BILITOT 0.9 0.6 0.7 0.6 0.5  PROT 7.7 6.6 7.0 7.3 7.1  ALBUMIN 3.5 2.8* 3.0* 3.1* 3.1*   CBC:  Recent Labs Lab 08/24/14 2123 08/25/14 0449 08/26/14 0528 08/27/14 0455 08/28/14 0550  WBC 11.3* 8.6 9.5 9.5 8.5  NEUTROABS 8.3* 5.8 6.7 6.9 5.5  HGB 15.1 13.8 14.7 14.4 14.8  HCT 41.9 38.4* 41.2 39.9 40.3  MCV 86.0 84.2 84.8 84.0  83.8  PLT 325 276 282 306 318    Recent Results (from the past 240 hour(s))  CULTURE, BLOOD (ROUTINE X 2)     Status: None   Collection Time    08/24/14  9:35 PM      Result Value Ref Range Status   Specimen Description BLOOD RIGHT ARM   Final   Special Requests BOTTLES DRAWN AEROBIC AND ANAEROBIC 5CC EACH   Final   Culture  Setup Time     Final   Value: 08/25/2014 00:42     Performed at [facility]   Culture     Final   Value:        BLOOD CULTURE RECEIVED NO GROWTH TO DATE CULTURE WILL BE HELD FOR 5 DAYS BEFORE ISSUING A FINAL NEGATIVE REPORT     Performed at [facility]   Report Status PENDING   Incomplete  CULTURE, BLOOD (ROUTINE X 2)     Status: None   Collection Time    08/24/14  9:54 PM      Result Value Ref Range Status    Specimen Description BLOOD LEFT ARM   Final   Special Requests BOTTLES DRAWN AEROBIC AND ANAEROBIC 10CC EACH   Final   Culture  Setup Time     Final   Value: 08/25/2014 00:42     Performed at [facility]   Culture     Final   Value:        BLOOD CULTURE RECEIVED NO GROWTH TO DATE CULTURE WILL BE HELD FOR 5 DAYS BEFORE ISSUING A FINAL NEGATIVE REPORT     Performed at [facility]   Report Status PENDING   Incomplete  CSF CULTURE     Status: None   Collection Time    08/24/14 11:51 PM      Result Value Ref Range Status   Specimen Description CSF   Final   Special Requests NONE   Final   Gram Stain     Final   Value: CYTOSPIN SLIDE WBC PRESENT, PREDOMINANTLY MONONUCLEAR     NO ORGANISMS SEEN     [facility]     [facility]   Culture     Final   Value: NO GROWTH 3 DAYS     Performed at [facility]   Report Status 08/28/2014 FINAL   Final  GRAM STAIN     Status: None   Collection Time    08/24/14 11:51 PM      Result Value Ref Range Status   Specimen Description CSF   Final   Special Requests NONE   Final   Gram Stain     Final   Value: WBC PRESENT, PREDOMINANTLY MONONUCLEAR     NO ORGANISMS SEEN     CYTOSPUN   Report Status 08/25/2014 FINAL   Final  URINE CULTURE     Status: None   Collection Time    08/25/14  2:20 AM      Result Value Ref Range Status   Specimen Description URINE, CLEAN CATCH   Final   Special Requests NONE   Final   Culture  Setup Time     Final   Value: 08/25/2014 03:00     Performed at [facility]   Colony Count     Final   Value: NO GROWTH     [facility]   Culture     Final  Value: NO GROWTH     Performed at [facility]   Report Status 08/26/2014 FINAL   Final  CULTURE, BLOOD (ROUTINE X 2)     Status: None   Collection Time    08/25/14  6:53 PM      Result Value Ref Range Status   Specimen Description BLOOD RIGHT HAND   Final    Special Requests BOTTLES DRAWN AEROBIC ONLY 6CC   Final   Culture  Setup Time     Final   Value: 08/26/2014 01:11     Performed at [facility]   Culture     Final   Value:        BLOOD CULTURE RECEIVED NO GROWTH TO DATE CULTURE WILL BE HELD FOR 5 DAYS BEFORE ISSUING A FINAL NEGATIVE REPORT     Performed at [facility]   Report Status PENDING   Incomplete  CULTURE, BLOOD (ROUTINE X 2)     Status: None   Collection Time    08/25/14  7:10 PM      Result Value Ref Range Status   Specimen Description BLOOD LEFT ARM   Final   Special Requests BOTTLES DRAWN AEROBIC ONLY 2CC   Final   Culture  Setup Time     Final   Value: 08/26/2014 01:11     Performed at [facility]   Culture     Final   Value:        BLOOD CULTURE RECEIVED NO GROWTH TO DATE CULTURE WILL BE HELD FOR 5 DAYS BEFORE ISSUING A FINAL NEGATIVE REPORT     Performed at [facility]   Report Status PENDING   Incomplete     Studies:  Mr Laqueta JeanBrain W Wo Contrast  08/25/2014 Thrombosis of the right transverse sinus which appears acute. This is felt to be septic thrombophlebitis related to acute mastoiditis, given the symptoms of headache and fever.  No acute infarct or abscess.   Mr Cervical Spine W Wo Contrast 08/25/2014 1. Shallow disc osteophyte complex on the left with mild left foraminal stenosis at C3-4. 2. Shallow disc osteophyte complex on the right with right foraminal stenosis at C6-7. 3. Normal MR appearance of the cervical spinal cord. No areas of abnormal contrast enhancement are identified.   Scheduled Meds: . cefTRIAXone (ROCEPHIN)  IV  2 g Intravenous Q24H  . metronidazole  500 mg Intravenous Q8H  . rivaroxaban  15 mg Oral BID WC   Followed by  . [START ON 09/18/2014] rivaroxaban  20 mg Oral Q supper  . vancomycin  1,250 mg Intravenous Q8H   Continuous Infusions:   Active Problems:   Headache   Rash   Septic thrombophlebitis   Acute mastoiditis   Thrombosis transverse  sinus   Time spent: 15  Pamella Pertostin Maddisen Vought, MD [facility] Pager [phone]. If 7 PM - 7 AM, please contact night-coverage at [email or web address], password [email or web address] 08/28/2014, 3:53 PM  LOS: 4 days

## 2014-08-28 NOTE — Progress Notes (Signed)
INFECTIOUS DISEASE PROGRESS NOTE  ID: Jack Graves is a 43 y.o. male with  Active Problems:   Headache   Rash   Septic thrombophlebitis   Acute mastoiditis   Thrombosis transverse sinus  Subjective: Denies dizziness, f/c, weakness. Headache better. Ambulated without difficulty  Abtx:  Anti-infectives   Start     Dose/Rate Route Frequency Ordered Stop   08/28/14 0230  vancomycin (VANCOCIN) 1,250 mg in sodium chloride 0.9 % 250 mL IVPB     1,250 mg 166.7 mL/hr over 90 Minutes Intravenous Every 8 hours 08/27/14 1919     08/25/14 1830  vancomycin (VANCOCIN) IVPB 1000 mg/200 mL premix  Status:  Discontinued     1,000 mg 200 mL/hr over 60 Minutes Intravenous Every 8 hours 08/25/14 1725 08/27/14 1919   08/25/14 1730  cefTRIAXone (ROCEPHIN) 2 g in dextrose 5 % 50 mL IVPB     2 g 100 mL/hr over 30 Minutes Intravenous Every 24 hours 08/25/14 1716     08/25/14 1730  metroNIDAZOLE (FLAGYL) IVPB 500 mg     500 mg 100 mL/hr over 60 Minutes Intravenous Every 8 hours 08/25/14 1716     08/25/14 0800  vancomycin (VANCOCIN) IVPB 1000 mg/200 mL premix  Status:  Discontinued     1,000 mg 200 mL/hr over 60 Minutes Intravenous Every 8 hours 08/25/14 0133 08/25/14 1136   08/25/14 0600  acyclovir (ZOVIRAX) 850 mg in dextrose 5 % 150 mL IVPB  Status:  Discontinued     850 mg 167 mL/hr over 60 Minutes Intravenous 3 times per day 08/25/14 0130 08/25/14 1235   08/25/14 0145  cefTRIAXone (ROCEPHIN) 2 g in dextrose 5 % 50 mL IVPB  Status:  Discontinued     2 g 100 mL/hr over 30 Minutes Intravenous Every 12 hours 08/25/14 0130 08/25/14 1136   08/25/14 0000  cefTRIAXone (ROCEPHIN) 2 g in dextrose 5 % 50 mL IVPB     2 g 100 mL/hr over 30 Minutes Intravenous  Once 08/24/14 2347 08/25/14 0055   08/25/14 0000  vancomycin (VANCOCIN) 2,000 mg in sodium chloride 0.9 % 500 mL IVPB     2,000 mg 250 mL/hr over 120 Minutes Intravenous  Once 08/24/14 2347 08/25/14 0120   08/24/14 2200  acyclovir (ZOVIRAX)  820 mg in dextrose 5 % 150 mL IVPB     10 mg/kg  82.2 kg (Ideal) 166.4 mL/hr over 60 Minutes Intravenous  Once 08/24/14 2144 08/25/14 0117      Medications:  Scheduled: . cefTRIAXone (ROCEPHIN)  IV  2 g Intravenous Q24H  . enoxaparin (LOVENOX) injection  1 mg/kg Subcutaneous Q12H  . metronidazole  500 mg Intravenous Q8H  . vancomycin  1,250 mg Intravenous Q8H    Objective: Vital signs in last 24 hours: Temp:  [97.5 F (36.4 C)-98.6 F (37 C)] 97.6 F (36.4 C) (10/26 0937) Pulse Rate:  [65-89] 72 (10/26 0937) Resp:  [16-18] 16 (10/26 0937) BP: (113-138)/(63-85) 116/70 mmHg (10/26 0937) SpO2:  [98 %-100 %] 98 % (10/26 0937) Weight:  [210 lb 6.4 oz (95.437 kg)] 210 lb 6.4 oz (95.437 kg) (10/26 0500)   General appearance: alert, cooperative and no distress Resp: clear to auscultation bilaterally Cardio: regular rate and rhythm GI: normal findings: bowel sounds normal and soft, non-tender  Lab Results  Recent Labs  08/27/14 0455 08/28/14 0550  WBC 9.5 8.5  HGB 14.4 14.8  HCT 39.9 40.3  NA 140 139  K 4.0 3.7  CL 100 101  CO2  27 28  BUN 13 12  CREATININE 1.10 1.08   Liver Panel  Recent Labs  08/27/14 0455 08/28/14 0550  PROT 7.3 7.1  ALBUMIN 3.1* 3.1*  AST 13 40*  ALT 16 39  ALKPHOS 53 55  BILITOT 0.6 0.5   Lab Results  Component Value Date   ESRSEDRATE 49* 08/25/2014   Lab Results  Component Value Date   CRP 9.4* 08/25/2014    Microbiology: Recent Results (from the past 240 hour(s))  CULTURE, BLOOD (ROUTINE X 2)     Status: None   Collection Time    08/24/14  9:35 PM      Result Value Ref Range Status   Specimen Description BLOOD RIGHT ARM   Final   Special Requests BOTTLES DRAWN AEROBIC AND ANAEROBIC 5CC EACH   Final   Culture  Setup Time     Final   Value: 08/25/2014 00:42     Performed at [facility]   Culture     Final   Value:        BLOOD CULTURE RECEIVED NO GROWTH TO DATE CULTURE WILL BE HELD FOR 5 DAYS BEFORE ISSUING A  FINAL NEGATIVE REPORT     Performed at [facility]   Report Status PENDING   Incomplete  CULTURE, BLOOD (ROUTINE X 2)     Status: None   Collection Time    08/24/14  9:54 PM      Result Value Ref Range Status   Specimen Description BLOOD LEFT ARM   Final   Special Requests BOTTLES DRAWN AEROBIC AND ANAEROBIC 10CC EACH   Final   Culture  Setup Time     Final   Value: 08/25/2014 00:42     Performed at [facility]   Culture     Final   Value:        BLOOD CULTURE RECEIVED NO GROWTH TO DATE CULTURE WILL BE HELD FOR 5 DAYS BEFORE ISSUING A FINAL NEGATIVE REPORT     Performed at [facility]   Report Status PENDING   Incomplete  CSF CULTURE     Status: None   Collection Time    08/24/14 11:51 PM      Result Value Ref Range Status   Specimen Description CSF   Final   Special Requests NONE   Final   Gram Stain     Final   Value: CYTOSPIN SLIDE WBC PRESENT, PREDOMINANTLY MONONUCLEAR     NO ORGANISMS SEEN     [facility]formed [facility]     Performed at [facility]   Culture     Final   Value: NO GROWTH 2 DAYS     Performed at [facility]   Report Status PENDING   Incomplete  GRAM STAIN     Status: None   Collection Time    08/24/14 11:51 PM      Result Value Ref Range Status   Specimen Description CSF   Final   Special Requests NONE   Final   Gram Stain     Final   Value: WBC PRESENT, PREDOMINANTLY MONONUCLEAR     NO ORGANISMS SEEN     CYTOSPUN   Report Status 08/25/2014 FINAL   Final  URINE CULTURE     Status: None   Collection Time    08/25/14  2:20 AM      Result Value Ref Range Status   Specimen Description URINE, CLEAN CATCH   Final  Special Requests NONE   Final   Culture  Setup Time     Final   Value: 08/25/2014 03:00     Performed at [facility]   Colony Count     Final   Value: NO GROWTH     Performed at [facility]   Culture     Final   Value: NO GROWTH     Performed at [facility]    Report Status 08/26/2014 FINAL   Final  CULTURE, BLOOD (ROUTINE X 2)     Status: None   Collection Time    08/25/14  6:53 PM      Result Value Ref Range Status   Specimen Description BLOOD RIGHT HAND   Final   Special Requests BOTTLES DRAWN AEROBIC ONLY 6CC   Final   Culture  Setup Time     Final   Value: 08/26/2014 01:11     Performed at [facility]   Culture     Final   Value:        BLOOD CULTURE RECEIVED NO GROWTH TO DATE CULTURE WILL BE HELD FOR 5 DAYS BEFORE ISSUING A FINAL NEGATIVE REPORT     Performed at [facility]   Report Status PENDING   Incomplete  CULTURE, BLOOD (ROUTINE X 2)     Status: None   Collection Time    08/25/14  7:10 PM      Result Value Ref Range Status   Specimen Description BLOOD LEFT ARM   Final   Special Requests BOTTLES DRAWN AEROBIC ONLY 2CC   Final   Culture  Setup Time     Final   Value: 08/26/2014 01:11     Performed at [facility]   Culture     Final   Value:        BLOOD CULTURE RECEIVED NO GROWTH TO DATE CULTURE WILL BE HELD FOR 5 DAYS BEFORE ISSUING A FINAL NEGATIVE REPORT     Performed at [facility]   Report Status PENDING   Incomplete    Studies/Results: No results found.   Assessment/Plan: R transverse sinus thrombosis after dental procedure MRI neck shows no infectious source BCx adn CSF cx are NGTD  Total days of antibiotics: 5 vanco/ceftriaxone/flagyl plus Heparin  Would continue on his current anbx and treat for duration of 6 wks. Currently on day 5 of 42. Until Dec 2nd, last day of antibiotics  - he order picc line. Will need weekly labs cbc, bmp while on antibiotics plus weekly dressing changes - we will see him back in the ID clinic in 4-5 wk  Gailen Venne Infectious Diseases (pager) [phone] [email or web address] 08/28/2014, 10:11 AM  LOS: 4 days

## 2014-08-28 NOTE — Progress Notes (Signed)
IV team called  for PICC line placement order

## 2014-08-28 NOTE — Telephone Encounter (Signed)
CALLED PATIENT RM [id number] AND GAVE NP APPT FOR 11/06 @ 1 W/DR. GORSUCH. INFORMATION PATIENT APPT WILL BE PRINTED OUT ON D/C SUMMARY WITH CONTACT INFORMATION.

## 2014-08-28 NOTE — Progress Notes (Signed)
ANTICOAGULATION CONSULT NOTE - Initial Consult  Pharmacy Consult:  Xarelto Indication:  Transverse sinus thrombosis  Allergies  Allergen Reactions  . Penicillins Rash  . Prednisone Rash    Patient Measurements: Height: 6\' 2"  (188 cm) Weight: 210 lb 6.4 oz (95.437 kg) IBW/kg (Calculated) : 82.2  Vital Signs: Temp: 97.6 F (36.4 C) (10/26 0937) Temp Source: Oral (10/26 0937) BP: 116/70 mmHg (10/26 0937) Pulse Rate: 72 (10/26 0937)  Labs:  Recent Labs  08/26/14 0528 08/27/14 0455 08/28/14 0550  HGB 14.7 14.4 14.8  HCT 41.2 39.9 40.3  PLT 282 306 318  CREATININE 1.07 1.10 1.08    Estimated Creatinine Clearance: 100.4 ml/min (by C-G formula based on Cr of 1.08).   Medical History: Past Medical History  Diagnosis Date  . Depression       Assessment: [age] YOM known to Pharmacy from Lovenox and antibiotic dosing.  Patient to transition from Lovenox to Xarelto for transverse sinus thrombosis, treated as a DVT per MD.  CMET and CBC stable.   Goal of Therapy:  Full anticoagulation Vanc trough 15-20 mcg/mL    Plan:  - Xarelto 15mg  PO BIDWM x 21 days, then on 09/18/14 start 20mg  PO daily with supper.  Pharmacy will sign off and monitor peripherally.  Thank you for the consult! - Continue Vanc 1250mg  IV Q8H - Continue Rocephin and Flagyl as ordered    Felica Chargois D. Laney Potashang, PharmD, BCPS Pager:  [phone] 08/28/2014, 1:49 PM

## 2014-08-28 NOTE — Progress Notes (Signed)
Discussed PICC placement with patient including risks, benefits, and alternatives. Consent obtained. Patient requests placement tomorrow AM. Primary RN notified.

## 2014-08-29 LAB — CBC WITH DIFFERENTIAL/PLATELET
BASOS PCT: 0 % (ref 0–1)
Basophils Absolute: 0 10*3/uL (ref 0.0–0.1)
EOS ABS: 0.4 10*3/uL (ref 0.0–0.7)
Eosinophils Relative: 5 % (ref 0–5)
HEMATOCRIT: 40.5 % (ref 39.0–52.0)
Hemoglobin: 14.3 g/dL (ref 13.0–17.0)
Lymphocytes Relative: 21 % (ref 12–46)
Lymphs Abs: 1.8 10*3/uL (ref 0.7–4.0)
MCH: 30.6 pg (ref 26.0–34.0)
MCHC: 35.3 g/dL (ref 30.0–36.0)
MCV: 86.7 fL (ref 78.0–100.0)
Monocytes Absolute: 0.6 10*3/uL (ref 0.1–1.0)
Monocytes Relative: 8 % (ref 3–12)
NEUTROS ABS: 5.7 10*3/uL (ref 1.7–7.7)
NEUTROS PCT: 66 % (ref 43–77)
Platelets: 311 10*3/uL (ref 150–400)
RBC: 4.67 MIL/uL (ref 4.22–5.81)
RDW: 12.2 % (ref 11.5–15.5)
WBC: 8.5 10*3/uL (ref 4.0–10.5)

## 2014-08-29 LAB — COMPREHENSIVE METABOLIC PANEL
ALK PHOS: 52 U/L (ref 39–117)
ALT: 70 U/L — ABNORMAL HIGH (ref 0–53)
ANION GAP: 12 (ref 5–15)
AST: 61 U/L — ABNORMAL HIGH (ref 0–37)
Albumin: 3 g/dL — ABNORMAL LOW (ref 3.5–5.2)
BILIRUBIN TOTAL: 0.5 mg/dL (ref 0.3–1.2)
BUN: 11 mg/dL (ref 6–23)
CHLORIDE: 104 meq/L (ref 96–112)
CO2: 26 meq/L (ref 19–32)
Calcium: 9.2 mg/dL (ref 8.4–10.5)
Creatinine, Ser: 1.09 mg/dL (ref 0.50–1.35)
GFR calc non Af Amer: 80 mL/min — ABNORMAL LOW (ref >90)
GLUCOSE: 99 mg/dL (ref 70–99)
Potassium: 4.2 mEq/L (ref 3.7–5.3)
Sodium: 142 mEq/L (ref 137–147)
Total Protein: 6.7 g/dL (ref 6.0–8.3)

## 2014-08-29 LAB — FACTOR 5 LEIDEN

## 2014-08-29 MED ORDER — METRONIDAZOLE 500 MG PO TABS
500.0000 mg | ORAL_TABLET | Freq: Three times a day (TID) | ORAL | Status: DC
  Administered 2014-08-29: 500 mg via ORAL
  Filled 2014-08-29: qty 1

## 2014-08-29 MED ORDER — VANCOMYCIN HCL 10 G IV SOLR: 1250.0000 mg | Freq: Three times a day (TID) | INTRAVENOUS | Status: DC

## 2014-08-29 MED ORDER — ACETAMINOPHEN 325 MG PO TABS
650.0000 mg | ORAL_TABLET | ORAL | Status: DC | PRN
  Administered 2014-08-29: 650 mg via ORAL
  Filled 2014-08-29: qty 2

## 2014-08-29 MED ORDER — RIVAROXABAN (XARELTO) VTE STARTER PACK (15 & 20 MG): ORAL_TABLET | ORAL | Status: DC

## 2014-08-29 MED ORDER — METRONIDAZOLE 500 MG PO TABS: 500.0000 mg | ORAL_TABLET | Freq: Three times a day (TID) | ORAL | Status: DC

## 2014-08-29 MED ORDER — RIVAROXABAN 20 MG PO TABS: 20.0000 mg | ORAL_TABLET | Freq: Every day | ORAL | Status: DC

## 2014-08-29 MED ORDER — SODIUM CHLORIDE 0.9 % IJ SOLN
10.0000 mL | INTRAMUSCULAR | Status: DC | PRN
  Administered 2014-08-29: 10 mL

## 2014-08-29 MED ORDER — HEPARIN SOD (PORK) LOCK FLUSH 100 UNIT/ML IV SOLN
250.0000 [IU] | Freq: Every day | INTRAVENOUS | Status: DC
  Filled 2014-08-29: qty 3

## 2014-08-29 MED ORDER — HEPARIN SOD (PORK) LOCK FLUSH 100 UNIT/ML IV SOLN
250.0000 [IU] | INTRAVENOUS | Status: DC | PRN
  Administered 2014-08-29: 250 [IU]
  Filled 2014-08-29: qty 3

## 2014-08-29 MED ORDER — DEXTROSE 5 % IV SOLN: 2.0000 g | Freq: Two times a day (BID) | INTRAVENOUS | Status: DC

## 2014-08-29 NOTE — Discharge Summary (Signed)
Physician Discharge Summary  [place] ZOX:[id number] DOB: 1970-11-04 DOA: 08/24/2014  PCP: [name], MD  Admit date: 08/24/2014 Discharge date: 08/29/2014  Time spent: 45 minutes  Recommendations for Outpatient Follow-up:  1. Follow up wit [name] in a week 2. Follow up with [name] in 3-4 weeks 3. Follow up with PCP in 1 week  4. Follow up with [facility] for antibiotics 5. Weekly CBC/CMP 6. Vancomycin troughs per home health agency  Discharge Diagnoses:  Active Problems:   Headache   Rash   Septic thrombophlebitis   Acute mastoiditis   Thrombosis transverse sinus  Discharge Condition: stable  Diet recommendation: regular  Filed Weights   08/27/14 0448 08/28/14 0500 08/29/14 0500  Weight: 96.752 kg (213 lb 4.8 oz) 95.437 kg (210 lb 6.4 oz) 95.44 kg (210 lb 6.5 oz)    History of present illness:  This is a 43 y.o. year old male with no significant past medical history of presenting with headache, fever, rash concerning for meningitis. Patient reports progressive severe right-sided headache over the past 2 weeks. Has seen his PCP about symptoms over the same time frame. Had some noted oral lesions within this time frame that was concerning for herpangina with aphthous ulcers per PCP note. was given prescription for Valtrex and Magic mouthwash with resolution of oral lesions. Patient has followup with his PCP the following week with worsening headache. Was put on NSAIDs. Patient seemed to have developed rash after use of NSAIDs per patient. Was recently switched to Valium today for headache. presents in the ER MAXIMUM TEMPERATURE 102.2, heart rate in the 70s to 90s, respirations intense 20s, blood pressure in the 110s to 140s, satting greater than 99% on room air. White blood cell count 11.3, hemoglobin 15, creatinine 1.16 INR 1.21. Head CT within normal limits. EDP concern for meningitis. LP performed at the bedside. CSF studies including protein, glucose,  cell count differential, Gram stain, culture, HSV, VDRL obtained. Patient started empirically on IV acyclovir, meningitic dose and Rocephin, vancomycin. I have asked for a formal neurology consult. 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 [place] Denies any recent appointments. No medications. States his immunizations are up-to-date. Denies any prior history of medical problems.  Hospital Course:  Acute mastoiditis with right transverse sinus thrombosis and concern for septic thrombophlebitis - patient admitted with initial concerns for meningitis given fever, headaches and neck pain however LP was rather unremarkable. He then underwent an MRI of brain which showed acute mastoiditis with right transverse sinus thrombosis. Neurology and ID were consulted and patient was started on Vancomycin/Ceftriazone/Metronidazole and he was started on anticoagulation with Xarelto. After antibiotics he has gradually improved and on the day of discharge his headache was completely resolved, he is afebrile and clinically stable. He is to have 6 weeks of IV antibiotics as above via PICC and will have follow up with ID and Hematology as an outpatient.   Procedures:  LP   Consultations:  Neurology  ID  Discharge Exam: Filed Vitals:   08/29/14 0500 08/29/14 0530 08/29/14 1013 08/29/14 1419  BP:  144/79 137/81 134/70  Pulse:  66 66 77  Temp:  97.9 F (36.6 C) 97.8 F (36.6 C) 98 F (36.7 C)  TempSrc:  Oral Oral Oral  Resp:  20 20 20   Height:      Weight: 95.44 kg (210 lb 6.5 oz)     SpO2:  99% 100% 100%    General: NAD Cardiovascular: RRR Respiratory:  CTA biL  Discharge Instructions     Medication List         cefTRIAXone 2 g in dextrose 5 % 50 mL  Inject 2 g into the vein 2 (two) times daily.     diazepam 10 MG tablet  Commonly known as:  VALIUM  Take 10 mg by mouth every 8 (eight) hours as needed for anxiety (muscle pain).     metroNIDAZOLE 500 MG tablet  Commonly  known as:  FLAGYL  Take 1 tablet (500 mg total) by mouth every 8 (eight) hours.     Rivaroxaban 15 & 20 MG Tbpk  Commonly known as:  XARELTO STARTER PACK  Take as directed on package: Start with one 15mg  tablet by mouth twice a day with food. On Day 22, switch to one 20mg  tablet once a day with food.     rivaroxaban 20 MG Tabs tablet  Commonly known as:  XARELTO  Take 1 tablet (20 mg total) by mouth daily with supper. After you finish the starter pack     vancomycin 1,250 mg in sodium chloride 0.9 % 250 mL  Inject 1,250 mg into the vein every 8 (eight) hours.           Follow-up Information   [facility]. Schedule an appointment as soon as possible for a visit in 1 week.   Specialty:  Family Medicine   Contact information:   [address] [place] [address] [phone]       Follow up with Judyann MunsonSNIDER, CYNTHIA, MD. Schedule an appointment as soon as possible for a visit in 3 weeks.   Specialty:  Infectious Diseases   Contact information:   [address] [place] [address] [phone]       The results of significant diagnostics from this hospitalization (including imaging, microbiology, ancillary and laboratory) are listed below for reference.    Significant Diagnostic Studies: Ct Head Wo Contrast  08/24/2014   CLINICAL DATA:  Severe right side headache  EXAM: CT HEAD WITHOUT CONTRAST  TECHNIQUE: Contiguous axial images were obtained from the base of the skull through the vertex without intravenous contrast.  COMPARISON:  None  FINDINGS: No skull fracture is noted. Paranasal sinuses and mastoid air cells are unremarkable. No intracranial hemorrhage, mass effect or midline shift. No acute cortical infarction. No mass lesion is noted on this unenhanced scan. The gray and white-matter differentiation is preserved.  IMPRESSION: No acute intracranial abnormality.   Electronically Signed   By: Natasha MeadLiviu  Pop M.D.   On: 08/24/2014  22:58   Mr Laqueta JeanBrain W ZYWo Contrast  08/25/2014   ADDENDUM REPORT: 08/25/2014 17:00  ADDENDUM: These results were called by telephone at the time of interpretation on 08/25/2014 at 4:45 Pm to Dr. Cyril Mourningamillo , who verbally acknowledged these results.   Electronically Signed   By: Marlan Palauharles  Clark M.D.   On: 08/25/2014 17:00   08/25/2014   CLINICAL DATA:  Severe right-sided headache. Fever. Rule out meningitis  EXAM: MRI HEAD WITHOUT AND WITH CONTRAST  TECHNIQUE: Multiplanar, multiecho pulse sequences of the brain and surrounding structures were obtained without and with intravenous contrast.  CONTRAST:  20mL MULTIHANCE GADOBENATE DIMEGLUMINE 529 MG/ML IV SOLN  COMPARISON:  CT head 08/24/2014  FINDINGS: Ventricle size is normal. Cerebral volume normal. Craniocervical junction normal. Pituitary relatively small in size.  Negative for acute or chronic infarction. Cerebral white matter is normal. Negative for demyelinating disease. Brainstem and cerebellum  are normal.  Negative for hemorrhage or mass lesion.  Normal enhancement following contrast infusion  Right mastoid sinus effusion with air-fluid levels. This may be due to acute infection and could be a cause of right-sided headaches.  Following contrast infusion, there is a filling defect in the right transverse sinus extending down to the sigmoid sinus compatible with acute thrombosis. This is felt to be septic thrombophlebitis related to acute mastoiditis. No acute infarct is identified.  Paranasal sinuses are clear.  IMPRESSION: Thrombosis of the right transverse sinus which appears acute. This is felt to be septic thrombophlebitis related to acute mastoiditis, given the symptoms of headache and fever.  No acute infarct or abscess.  Electronically Signed: By: Marlan Palauharles  Clark M.D. On: 08/25/2014 16:11   Mr Cervical Spine W Wo Contrast  08/25/2014   CLINICAL DATA:  Progressive right-sided headache with right neck pain. Clinical concern for meningitis.  EXAM: MRI  CERVICAL SPINE WITHOUT AND WITH CONTRAST  TECHNIQUE: Multiplanar and multiecho pulse sequences of the cervical spine, to include the craniocervical junction and cervicothoracic junction, were obtained according to standard protocol without and with intravenous contrast.  CONTRAST:  20mL MULTIHANCE GADOBENATE DIMEGLUMINE 529 MG/ML IV SOLN  COMPARISON:  None.  FINDINGS: Normal alignment of the cervical vertebral bodies. No significant bony findings. The cervical spinal cord is normal. No worrisome enhancement. No dural enhancement.  C2-3:  No significant findings.  C3-4: Shallow disc osteophyte complex on the left with mild left foraminal stenosis.  C4-5:  No significant findings.  C5-6:  No significant findings.  C6-7: Shallow disc osteophyte complex on the right with right foraminal stenosis possibly irritating the right C7 nerve root.  C7-T1:  No significant findings.  IMPRESSION: 1. Shallow disc osteophyte complex on the left with mild left foraminal stenosis at C3-4. 2. Shallow disc osteophyte complex on the right with right foraminal stenosis at C6-7. 3. Normal MR appearance of the cervical spinal cord. No areas of abnormal contrast enhancement are identified.   Electronically Signed   By: Loralie ChampagneMark  Gallerani M.D.   On: 08/25/2014 17:37   Dg Chest Port 1 View  08/24/2014   CLINICAL DATA:  43 year old male with acute fever, right side head and neck pain. Initial encounter.  EXAM: PORTABLE CHEST - 1 VIEW  COMPARISON:  None.  FINDINGS: Portable AP semi upright view at at 2159 hrs. Mildly low lung volumes. Normal cardiac size and mediastinal contours. No pneumothorax, pulmonary edema, pleural effusion or confluent pulmonary opacity. No osseous abnormality identified.  IMPRESSION: Somewhat low lung volumes, otherwise no acute cardiopulmonary abnormality.   Electronically Signed   By: Augusto GambleLee  Hall M.D.   On: 08/24/2014 22:18    Microbiology: Recent Results (from the past 240 hour(s))  CULTURE, BLOOD (ROUTINE X 2)      Status: None   Collection Time    08/24/14  9:35 PM      Result Value Ref Range Status   Specimen Description BLOOD RIGHT ARM   Final   Special Requests BOTTLES DRAWN AEROBIC AND ANAEROBIC 5CC EACH   Final   Culture  Setup Time     Final   Value: 08/25/2014 00:42     Performed at [facility]   Culture     Final   Value:        BLOOD CULTURE RECEIVED NO GROWTH TO DATE CULTURE WILL BE HELD FOR 5 DAYS BEFORE ISSUING A FINAL NEGATIVE REPORT     Performed at [facility]  Report Status PENDING   Incomplete  CULTURE, BLOOD (ROUTINE X 2)     Status: None   Collection Time    08/24/14  9:54 PM      Result Value Ref Range Status   Specimen Description BLOOD LEFT ARM   Final   Special Requests BOTTLES DRAWN AEROBIC AND ANAEROBIC 10CC EACH   Final   Culture  Setup Time     Final   Value: 08/25/2014 00:42     Performed at [facility]   Culture     Final   Value:        BLOOD CULTURE RECEIVED NO GROWTH TO DATE CULTURE WILL BE HELD FOR 5 DAYS BEFORE ISSUING A FINAL NEGATIVE REPORT     Performed at [facility]   Report Status PENDING   Incomplete  CSF CULTURE     Status: None   Collection Time    08/24/14 11:51 PM      Result Value Ref Range Status   Specimen Description CSF   Final   Special Requests NONE   Final   Gram Stain     Final   Value: CYTOSPIN SLIDE WBC PRESENT, PREDOMINANTLY MONONUCLEAR     NO ORGANISMS SEEN     Performed at [facility]     Performed at [facility]   Culture     Final   Value: NO GROWTH 3 DAYS     Performed at [facility]   Report Status 08/28/2014 FINAL   Final  GRAM STAIN     Status: None   Collection Time    08/24/14 11:51 PM      Result Value Ref Range Status   Specimen Description CSF   Final   Special Requests NONE   Final   Gram Stain     Final   Value: WBC PRESENT, PREDOMINANTLY MONONUCLEAR     NO ORGANISMS SEEN     CYTOSPUN   Report Status 08/25/2014 FINAL   Final  URINE CULTURE      Status: None   Collection Time    08/25/14  2:20 AM      Result Value Ref Range Status   Specimen Description URINE, CLEAN CATCH   Final   Special Requests NONE   Final   Culture  Setup Time     Final   Value: 08/25/2014 03:00     Performed at [facility] Count     Final   Value: NO GROWTH     Performed at [facility]   Culture     Final   Value: NO GROWTH     Performed at [facility]   Report Status 08/26/2014 FINAL   Final  CULTURE, BLOOD (ROUTINE X 2)     Status: None   Collection Time    08/25/14  6:53 PM      Result Value Ref Range Status   Specimen Description BLOOD RIGHT HAND   Final   Special Requests BOTTLES DRAWN AEROBIC ONLY 6CC   Final   Culture  Setup Time     Final   Value: 08/26/2014 01:11     Performed at [facility]   Culture     Final   Value:        BLOOD CULTURE RECEIVED NO GROWTH TO DATE CULTURE WILL BE HELD FOR 5 DAYS BEFORE ISSUING A FINAL NEGATIVE REPORT     Performed at [facility]   Report Status PENDING   Incomplete  CULTURE, BLOOD (ROUTINE X 2)     Status: None   Collection Time    08/25/14  7:10 PM      Result Value Ref Range Status   Specimen Description BLOOD LEFT ARM   Final   Special Requests BOTTLES DRAWN AEROBIC ONLY 2CC   Final   Culture  Setup Time     Final   Value: 08/26/2014 01:11     Performed at [facility]   Culture     Final   Value:        BLOOD CULTURE RECEIVED NO GROWTH TO DATE CULTURE WILL BE HELD FOR 5 DAYS BEFORE ISSUING A FINAL NEGATIVE REPORT     Performed at [facility]   Report Status PENDING   Incomplete     Labs: Basic Metabolic Panel:  Recent Labs Lab 08/24/14 2123 08/25/14 0449 08/26/14 0528 08/27/14 0455 08/28/14 0550 08/29/14 0526  NA 140 139 136* 140 139 142  K 3.5* 3.6* 4.2 4.0 3.7 4.2  CL 100 104 102 100 101 104  CO2 26 21 22 27 28 26   GLUCOSE 104* 90 101* 113* 88 99  BUN 12 10 11 13 12 11   CREATININE 1.16 1.03 1.07 1.10  1.08 1.09  CALCIUM 9.1 8.4 9.2 9.3 9.1 9.2  MG  --  1.9  --   --   --   --    Liver Function Tests:  Recent Labs Lab 08/25/14 0449 08/26/14 0528 08/27/14 0455 08/28/14 0550 08/29/14 0526  AST 13 15 13  40* 61*  ALT 15 15 16  39 70*  ALKPHOS 56 54 53 55 52  BILITOT 0.6 0.7 0.6 0.5 0.5  PROT 6.6 7.0 7.3 7.1 6.7  ALBUMIN 2.8* 3.0* 3.1* 3.1* 3.0*   CBC:  Recent Labs Lab 08/25/14 0449 08/26/14 0528 08/27/14 0455 08/28/14 0550 08/29/14 0526  WBC 8.6 9.5 9.5 8.5 8.5  NEUTROABS 5.8 6.7 6.9 5.5 5.7  HGB 13.8 14.7 14.4 14.8 14.3  HCT 38.4* 41.2 39.9 40.3 40.5  MCV 84.2 84.8 84.0 83.8 86.7  PLT 276 282 306 318 311    Signed:  GHERGHE, COSTIN  [facility] 08/29/2014, 7:03 PM

## 2014-08-29 NOTE — Discharge Instructions (Signed)
You were cared for by a hospitalist during your hospital stay. If you have any questions about your discharge medications or the care you received while you were in the hospital after you are discharged, you can call the unit and asked to speak with the hospitalist on call if the hospitalist that took care of you is not available. Once you are discharged, your primary care physician will handle any further medical issues. Please note that NO REFILLS for any discharge medications will be authorized once you are discharged, as it is imperative that you return to your primary care physician (or establish a relationship with a primary care physician if you do not have one) for your aftercare needs so that they can reassess your need for medications and monitor your lab values.     If you do not have a primary care physician, you can call [phone] for a physician referral.  [facility], MD in 5-7 days   Get CBC, CMP checked by your doctor and again as further instructed.  Get a 2 view Chest X ray done next visit if you had Pneumonia of Lung problems at the Hospital.  Get Medicines reviewed and adjusted.  Please request your Prim.MD to go over all Hospital Tests and Procedure/Radiological results at the follow up, please get all Hospital records sent to your Prim MD by signing hospital release before you go home.  Activity: As tolerated with Full fall precautions use walker/cane & assistance as needed  Diet: regular  For Heart failure patients - Check your Weight same time everyday, if you gain over 2 pounds, or you develop in leg swelling, experience more shortness of breath or chest pain, call your Primary MD immediately. Follow Cardiac Low Salt Diet and 1.8 lit/day fluid restriction.  Disposition Home   If you experience worsening of your admission symptoms, develop shortness of breath, life threatening emergency, suicidal or homicidal thoughts you must seek medical  attention immediately by calling 911 or calling your MD immediately  if symptoms less severe.  You Must read complete instructions/literature along with all the possible adverse reactions/side effects for all the Medicines you take and that have been prescribed to you. Take any new Medicines after you have completely understood and accpet all the possible adverse reactions/side effects.   Do not drive and provide baby sitting services if your were admitted for syncope or siezures until you have seen by Primary MD or a Neurologist and advised to do so again.  Do not drive when taking Pain medications.   Do not take more than prescribed Pain, Sleep and Anxiety Medications  Special Instructions: If you have smoked or chewed Tobacco  in the last 2 yrs please stop smoking, stop any regular Alcohol  and or any Recreational drug use.  Wear Seat belts while driving.

## 2014-08-29 NOTE — Progress Notes (Signed)
1418 - discharge instructions and prescription given to pt. Pt verbally acknowledged understanding.  Pt voiced no questions when prompted. Pt stated family member will transport him home by private car. Pt to be transported per wheelchair to lobby by staff.  Will monitor.   Andrew AuVafiadis, Kristianne Albin I 08/29/2014 2:28 PM

## 2014-08-29 NOTE — Progress Notes (Signed)
Pt refused wheelchair when one offered.  No acute distress noted.  Pt with family member with belongings in hand.  Andrew AuVafiadis, Lasaundra Riche I 08/29/2014 3:55 PM

## 2014-08-29 NOTE — Progress Notes (Signed)
NEURO HOSPITALIST PROGRESS NOTE   SUBJECTIVE:                                                                                                                        Continuous improvement. Notes decrease in headache severity. No visual deficits. No focal motor or sensory changes.  CSF negative for infection. PICC line for long term antibiotics. ID following  OBJECTIVE:                                                                                                                           Vital signs in last 24 hours: Temp:  [97.6 F (36.4 C)-99 F (37.2 C)] 97.9 F (36.6 C) (10/27 0530) Pulse Rate:  [66-76] 66 (10/27 0530) Resp:  [16-20] 20 (10/27 0530) BP: (113-144)/(65-85) 144/79 mmHg (10/27 0530) SpO2:  [98 %-100 %] 99 % (10/27 0530) Weight:  [95.44 kg (210 lb 6.5 oz)] 95.44 kg (210 lb 6.5 oz) (10/27 0500)  Intake/Output from previous day: 10/26 0701 - 10/27 0700 In: 1120 [P.O.:1120] Out: -  Intake/Output this shift:   Nutritional status: Cardiac  Past Medical History  Diagnosis Date  . Depression   Physical exam: pleasant male in no apparent distress.  Head: normocephalic.  Neck: supple, no bruits, no JVD.  Cardiac: no murmurs.  Lungs: clear.  Abdomen: soft, no tender, no mass.  Extremities: no edema   Neurologic Exam:  Mental Status:  Alert, oriented, thought content appropriate. Speech fluent without evidence of aphasia. Able to follow commands without difficulty.  Cranial Nerves:  II-Visual fields were normal.  III/IV/VI-Pupils were equal and reacted. Extraocular movements were full and conjugate.  V/VII-no facial numbness and no facial weakness.  VIII-normal.  X-normal speech and symmetrical palatal movement.  Motor: 5/5 bilaterally with normal tone and bulk  Sensory: Normal throughout.  Deep Tendon Reflexes: Trace to 1+ and symmetric.  Plantars: Flexor bilaterally  Cerebellar: Normal finger-to-nose testing.  Gait:  no tested.   Lab Results: No components found with this basename: cbc,  bmp,  coags,  chol,  tri,  ldl,  hga1c   Lipid Panel No results found for this basename: CHOL, TRIG, HDL, CHOLHDL, VLDL, LDLCALC,  in the last 72 hours  Studies/Results: No results  found.  MEDICATIONS                                                                                                                        Scheduled: . cefTRIAXone (ROCEPHIN)  IV  2 g Intravenous Q24H  . metronidazole  500 mg Intravenous Q8H  . rivaroxaban  15 mg Oral BID WC   Followed by  . [START ON 09/18/2014] rivaroxaban  20 mg Oral Q supper  . vancomycin  1,250 mg Intravenous Q8H    ASSESSMENT/PLAN:                                                                                                           43 y/o acute R transverse sinus thrombosis, presumably septic thrombophlebitis related to acute mastoiditis. Right mastoid sinus effusion with air-fluid levels.  Doing better this morning.  Continue antibiotics per ID recs Per discussion with hematology patient started on Xarelto. Outpatient follow up with Dr Bertis RuddyGorsuch Will need repeat neuroimaging in few weeks. Plan neurology follow [facility] Will continue to follow.  Elspeth Choeter Velda Wendt, DO [facility] [phone]  If 7pm- 7am, please page neurology on call as listed in AMION.   08/29/2014, 9:33 AM

## 2014-08-31 LAB — CULTURE, BLOOD (ROUTINE X 2)
Culture: NO GROWTH
Culture: NO GROWTH

## 2014-09-01 LAB — CULTURE, BLOOD (ROUTINE X 2)
CULTURE: NO GROWTH
Culture: NO GROWTH

## 2014-09-02 ENCOUNTER — Telehealth: Payer: Self-pay | Admitting: Family Medicine

## 2014-09-02 NOTE — Telephone Encounter (Signed)
[facility], pt complained of rash all not sure if related to antibiotics, being treated for mastoiditis , infective thrombopheblitis,. I reviewed chart he acutually had rash when he presented to ER on 10/22 it was noted in discharge and was prior to antibiotics given, seen by ID as well Vitals otherrwise stable He is allergic to prednisone  Will dose him with Benadryl 25mg  30 minutes before his Vanc infusions q 8 hrs, he is also on Rocephin BID I doubt related to vanc as rash was there previously but this will help with itching.  Will get f/u in office on Monday as his ID appt is a few weeks away

## 2014-09-06 ENCOUNTER — Telehealth: Payer: Self-pay | Admitting: *Deleted

## 2014-09-06 NOTE — Telephone Encounter (Signed)
Received call from Amy, [facility] RN.  Patient had reaction to vancomycin 10/31, was advised to stop it.  Pt stated he would be worked into the schedule this week.  RN spoke with Dr. Drue SecondSnider, pt given appointment 11/5 at 9:00. Pt notified, accepted. Andree CossHowell, [place] [employer] M, RN

## 2014-09-07 ENCOUNTER — Ambulatory Visit: Admitting: Internal Medicine

## 2014-09-07 ENCOUNTER — Ambulatory Visit (INDEPENDENT_AMBULATORY_CARE_PROVIDER_SITE_OTHER): Admitting: Internal Medicine

## 2014-09-07 VITALS — BP 121/80 | HR 71 | Temp 98.2°F | Wt 209.0 lb

## 2014-09-07 DIAGNOSIS — G08 Intracranial and intraspinal phlebitis and thrombophlebitis: Secondary | ICD-10-CM

## 2014-09-07 DIAGNOSIS — M26629 Arthralgia of temporomandibular joint, unspecified side: Secondary | ICD-10-CM

## 2014-09-07 DIAGNOSIS — I809 Phlebitis and thrombophlebitis of unspecified site: Secondary | ICD-10-CM

## 2014-09-07 DIAGNOSIS — M2662 Arthralgia of temporomandibular joint: Secondary | ICD-10-CM

## 2014-09-07 DIAGNOSIS — L739 Follicular disorder, unspecified: Secondary | ICD-10-CM

## 2014-09-07 DIAGNOSIS — H70001 Acute mastoiditis without complications, right ear: Secondary | ICD-10-CM

## 2014-09-07 NOTE — Progress Notes (Signed)
Subjective:    Patient ID: Jack Graves, male    DOB: 02/18/1971, 43 y.o.   MRN: [id number]  HPI 43yo M in previous good state of health, had headache,neck pain following dental procedures in early October. He was admitted to the hospital for intractable headache, fever, leukocytosis. He was found to have acute mastoiditis, right transverse sinus septic thrombophlebitis on MRI, likely related to recent dental work. He was discharged from hospital on ceftriaxone, vancomycin, and oral metronidazole for acute mastoiditis, right transverse sinus septic thrombophlebitis, as well as anticoagulation. All blood cx were negative. The patient on admit to the hospital did mention having scattered pustule/rash c/w folliculitis prior to admit. When he went home from the hospital,  He noticed similar rash developed to forehead, neck in hairline- bilaterally, chest and scattered lesions on arms. He was worried this was due to vancomycin, thus he discontinued vanco on 10/31. He states that after cessation of vancomycin, skin lesions were drying out and he no longer suffered from diarrhea. He denies a pruritic component of rash, nor did it ever look like macular papular lesions. He does show us photos of the rash that he describes to face, head, neck and chest...which appears more consistent with folliculitis. They are small papules with whiteheads. He states htat he would express them with his hands.he washes with dial soap, uses lotion on his face, he has held off on shaving since he doesn't want to worsen his rash.  He is concerned that he has allergy to antibiotics since he is allergic to many things, takes allergy shots.  Allergies  Allergen Reactions  . Penicillins Rash  . Prednisone Rash  . Vancomycin Diarrhea and Rash   Current Outpatient Prescriptions on File Prior to Visit  Medication Sig Dispense Refill  . diazepam (VALIUM) 10 MG tablet Take 10 mg by mouth every 8 (eight) hours as needed for  anxiety (muscle pain).    . metroNIDAZOLE (FLAGYL) 500 MG tablet Take 1 tablet (500 mg total) by mouth every 8 (eight) hours. 114 tablet 0  . Rivaroxaban (XARELTO STARTER PACK) 15 & 20 MG TBPK Take as directed on package: Start with one 15mg  tablet by mouth twice a day with food. On Day 22, switch to one 20mg  tablet once a day with food. 51 each 0  . rivaroxaban (XARELTO) 20 MG TABS tablet Take 1 tablet (20 mg total) by mouth daily with supper. After you finish the starter pack 30 tablet 0   No current facility-administered medications on file prior to visit.      Review of Systems +right TMJ pain, with chewing.  HA and neck pain improved    Objective:   Physical Exam  BP 121/80 mmHg  Pulse 71  Temp(Src) 98.2 F (36.8 C) (Oral)  Wt 209 lb (94.802 kg) Physical Exam  Constitutional: He is oriented to person, place, and time. He appears well-developed and well-nourished. No distress.  HENT:  Mouth/Throat: Oropharynx is clear and moist. No oropharyngeal exudate.  Cardiovascular: Normal rate, regular rhythm and normal heart sounds. Exam reveals no gallop and no friction rub.  No murmur heard.  Pulmonary/Chest: Effort normal and breath sounds normal. No respiratory distress. He has no wheezes.  Lymphadenopathy:  He has no cervical adenopathy.  Ext: right picc line c/d/i Skin: Skin is warm and dry. Scattered healing papules in hairline of neck bilaterally. Healed lesions to forehead. A few whitehead papules on forearm.        Assessment & Plan:  Acute mastoiditis and right transvere sinus septic thrombophlebitis = continue with anticoagulation with xarelto. We will switch him to ceftaroline plus continue on oral metronidazole so that he gets mrsa coverage and anaerobic coverage.  Folliculitis= i do not think his rash is consistent with drug allergy rash that we see most commonly with vancomycin. It does not have diffuse macular papular rash appearance. alittle unusual to have  folliculitis while on vancomycin since thought to be related to mrsa/ mssa. Asked him to use otc facial cleanser to treat acne that would contain benzoyl peroxide or sacylicilate/sulfur.   Will refer to dermatology  Right tmj pain = likely still sequelae of his infection. Continue to monitor  rtc in dec 3rd

## 2014-09-08 ENCOUNTER — Ambulatory Visit: Admitting: Hematology and Oncology

## 2014-09-08 ENCOUNTER — Ambulatory Visit

## 2014-09-12 DIAGNOSIS — H70091 Acute mastoiditis with other complications, right ear: Secondary | ICD-10-CM

## 2014-09-12 DIAGNOSIS — Z7901 Long term (current) use of anticoagulants: Secondary | ICD-10-CM

## 2014-09-12 DIAGNOSIS — Z452 Encounter for adjustment and management of vascular access device: Secondary | ICD-10-CM

## 2014-09-12 DIAGNOSIS — G039 Meningitis, unspecified: Secondary | ICD-10-CM

## 2014-09-12 DIAGNOSIS — G08 Intracranial and intraspinal phlebitis and thrombophlebitis: Secondary | ICD-10-CM

## 2014-10-05 ENCOUNTER — Encounter: Payer: Self-pay | Admitting: *Deleted

## 2014-10-05 ENCOUNTER — Ambulatory Visit (INDEPENDENT_AMBULATORY_CARE_PROVIDER_SITE_OTHER): Admitting: Internal Medicine

## 2014-10-05 ENCOUNTER — Encounter: Payer: Self-pay | Admitting: Internal Medicine

## 2014-10-05 VITALS — BP 130/75 | HR 68 | Wt 211.0 lb

## 2014-10-05 DIAGNOSIS — I809 Phlebitis and thrombophlebitis of unspecified site: Secondary | ICD-10-CM

## 2014-10-05 MED ORDER — DOXYCYCLINE HYCLATE 100 MG PO TABS: 100.0000 mg | ORAL_TABLET | Freq: Two times a day (BID) | ORAL | Status: DC

## 2014-10-05 MED ORDER — ONDANSETRON HCL 4 MG PO TABS: 4.0000 mg | ORAL_TABLET | Freq: Three times a day (TID) | ORAL | Status: DC | PRN

## 2014-10-05 NOTE — Progress Notes (Signed)
RN received verbal order to discontinue the patient's PICC line.  Patient identified with name and date of birth. PICC dressing removed, site unremarkable.   PICC line removed using sterile procedure @ 1000. PICC length equal to that noted in patient's hospital chart of 40 cm. Sterile petroleum gauze + sterile 4X4 applied to PICC site, pressure applied for 10 minutes and covered with Medipore tape as a pressure dressing. Patient tolerated procedure without complaints.  Patient instructed to limit use of arm for 1 hour. Patient instructed that the pressure dressing should remain in place for 24 hours. Patient verbalized understanding of these instructions.

## 2014-10-06 ENCOUNTER — Telehealth: Payer: Self-pay | Admitting: *Deleted

## 2014-10-06 NOTE — Telephone Encounter (Signed)
Called the patient to advise that a referal for his MRI has been made to [facility] for 10/20/14 at 5 pm arrive at 445 pm. The patient advised he will not be in town and needs to reschedule. Advised the patient to call and reschedule per his schedule and gave him the number to do so [phone].

## 2014-10-09 ENCOUNTER — Telehealth: Payer: Self-pay | Admitting: *Deleted

## 2014-10-09 NOTE — Progress Notes (Signed)
Subjective:    Patient ID: Jack Graves, male    DOB: 04/25/1971, 43 y.o.   MRN: [id number]  HPI 43yo M in previous good state of health, had headache,neck pain following dental procedures in early October. He was admitted to the hospital for intractable headache, fever, leukocytosis. He was found to have acute mastoiditis, right transverse sinus septic thrombophlebitis on MRI, likely related to recent dental work. He was discharged from hospital on ceftriaxone, vancomycin, and oral metronidazole for acute mastoiditis, right transverse sinus septic thrombophlebitis, as well as anticoagulation. All blood cx were negative. He was transitioned to ceftaroline and metronidazole due to rash that was thought to be due to vancomycin. He has now completed 6 wks of iv antibiotics and feeling back to baseline. He denies any neck pain, jaw pain or headache. Rash completely resolved. He is looking to get back to work. He also reports that he is continue to take his anticoagulant, xarelto.   Current Outpatient Prescriptions on File Prior to Visit  Medication Sig Dispense Refill  . rivaroxaban (XARELTO) 20 MG TABS tablet Take 1 tablet (20 mg total) by mouth daily with supper. After you finish the starter pack 30 tablet 0  . diazepam (VALIUM) 10 MG tablet Take 10 mg by mouth every 8 (eight) hours as needed for anxiety (muscle pain).    . metroNIDAZOLE (FLAGYL) 500 MG tablet Take 1 tablet (500 mg total) by mouth every 8 (eight) hours. (Patient not taking: Reported on 10/05/2014) 114 tablet 0  . Rivaroxaban (XARELTO STARTER PACK) 15 & 20 MG TBPK Take as directed on package: Start with one 15mg  tablet by mouth twice a day with food. On Day 22, switch to one 20mg  tablet once a day with food. (Patient not taking: Reported on 10/05/2014) 51 each 0   No current facility-administered medications on file prior to visit.   Active Ambulatory Problems    Diagnosis Date Noted  . Hematoma of toe of right foot 12/13/2013   . Headache 08/25/2014  . Meningitis 08/25/2014  . Rash 08/25/2014  . Septic thrombophlebitis 08/26/2014  . Acute mastoiditis 08/26/2014  . Thrombosis transverse sinus 08/26/2014   Resolved Ambulatory Problems    Diagnosis Date Noted  . No Resolved Ambulatory Problems   Past Medical History  Diagnosis Date  . Depression      Review of Systems 10 point ros is negative    Objective:   Physical Exam BP 130/75 mmHg  Pulse 68  Wt 211 lb (95.709 kg) Physical Exam  Constitutional: He is oriented to person, place, and time. He appears well-developed and well-nourished. No distress.  HENT:  Mouth/Throat: Oropharynx is clear and moist. No oropharyngeal exudate.  Cardiovascular: Normal rate, regular rhythm and normal heart sounds. Exam reveals no gallop and no friction rub.  No murmur heard.  Pulmonary/Chest: Effort normal and breath sounds normal. No respiratory distress. He has no wheezes.  Abdominal: Soft. Bowel sounds are normal. He exhibits no distension. There is no tenderness.  Lymphadenopathy:  He has no cervical adenopathy.  Neurological: He is alert and oriented to person, place, and time.  Skin: Skin is warm and dry. No rash noted. No erythema.  Psychiatric: He has a normal mood and affect. His behavior is normal.        Assessment & Plan:  right transverse sinus septic thrombophlebitis = will complete his Iv antibiotics today. Will switch him to oral antibiotics with doxycycline 100 mg bid, can use anti nausea tabs if needed for  addn 4 wk. In the meantime, will need for him to see neurology to determine end date for anticoagulation for transverse sinus thrombophlebitis.

## 2014-10-09 NOTE — Telephone Encounter (Signed)
Patient called and advised he had a fever last night 101. He advised he has also been having chills, sweats and weakness. He also wanted to let the doctor know that he started his doxy on 10/08/14 as he just picked it up then.

## 2014-10-10 ENCOUNTER — Emergency Department (HOSPITAL_COMMUNITY)

## 2014-10-10 ENCOUNTER — Encounter (HOSPITAL_COMMUNITY): Payer: Self-pay | Admitting: Emergency Medicine

## 2014-10-10 ENCOUNTER — Emergency Department (HOSPITAL_COMMUNITY)
Admission: EM | Admit: 2014-10-10 | Discharge: 2014-10-10 | Disposition: A | Discharge status: Home / Self Care | Attending: Emergency Medicine | Admitting: Emergency Medicine

## 2014-10-10 DIAGNOSIS — R519 Headache, unspecified: Secondary | ICD-10-CM

## 2014-10-10 DIAGNOSIS — R197 Diarrhea, unspecified: Secondary | ICD-10-CM | POA: Insufficient documentation

## 2014-10-10 DIAGNOSIS — Z880 Allergy status to penicillin: Secondary | ICD-10-CM | POA: Diagnosis not present

## 2014-10-10 DIAGNOSIS — M791 Myalgia: Secondary | ICD-10-CM | POA: Diagnosis not present

## 2014-10-10 DIAGNOSIS — R51 Headache: Secondary | ICD-10-CM | POA: Insufficient documentation

## 2014-10-10 DIAGNOSIS — Z792 Long term (current) use of antibiotics: Secondary | ICD-10-CM | POA: Insufficient documentation

## 2014-10-10 DIAGNOSIS — Z7901 Long term (current) use of anticoagulants: Secondary | ICD-10-CM | POA: Insufficient documentation

## 2014-10-10 DIAGNOSIS — R509 Fever, unspecified: Secondary | ICD-10-CM | POA: Diagnosis not present

## 2014-10-10 DIAGNOSIS — Z8659 Personal history of other mental and behavioral disorders: Secondary | ICD-10-CM | POA: Diagnosis not present

## 2014-10-10 LAB — COMPREHENSIVE METABOLIC PANEL
ALT: 28 U/L (ref 0–53)
ANION GAP: 12 (ref 5–15)
AST: 21 U/L (ref 0–37)
Albumin: 3.6 g/dL (ref 3.5–5.2)
Alkaline Phosphatase: 60 U/L (ref 39–117)
BUN: 11 mg/dL (ref 6–23)
CHLORIDE: 101 meq/L (ref 96–112)
CO2: 27 mEq/L (ref 19–32)
CREATININE: 0.95 mg/dL (ref 0.50–1.35)
Calcium: 9.4 mg/dL (ref 8.4–10.5)
GFR calc non Af Amer: >90 mL/min (ref >90)
Glucose, Bld: 93 mg/dL (ref 70–99)
POTASSIUM: 4.2 meq/L (ref 3.7–5.3)
Sodium: 140 mEq/L (ref 137–147)
Total Bilirubin: 0.6 mg/dL (ref 0.3–1.2)
Total Protein: 7.4 g/dL (ref 6.0–8.3)

## 2014-10-10 LAB — CBC WITH DIFFERENTIAL/PLATELET
BASOS ABS: 0 10*3/uL (ref 0.0–0.1)
Basophils Relative: 0 % (ref 0–1)
Eosinophils Absolute: 0.4 10*3/uL (ref 0.0–0.7)
Eosinophils Relative: 4 % (ref 0–5)
HEMATOCRIT: 43.1 % (ref 39.0–52.0)
HEMOGLOBIN: 15.3 g/dL (ref 13.0–17.0)
Lymphocytes Relative: 22 % (ref 12–46)
Lymphs Abs: 2.1 10*3/uL (ref 0.7–4.0)
MCH: 30.7 pg (ref 26.0–34.0)
MCHC: 35.5 g/dL (ref 30.0–36.0)
MCV: 86.5 fL (ref 78.0–100.0)
MONO ABS: 0.8 10*3/uL (ref 0.1–1.0)
Monocytes Relative: 9 % (ref 3–12)
NEUTROS ABS: 6 10*3/uL (ref 1.7–7.7)
Neutrophils Relative %: 65 % (ref 43–77)
Platelets: 244 10*3/uL (ref 150–400)
RBC: 4.98 MIL/uL (ref 4.22–5.81)
RDW: 12.7 % (ref 11.5–15.5)
WBC: 9.3 10*3/uL (ref 4.0–10.5)

## 2014-10-10 LAB — I-STAT CG4 LACTIC ACID, ED: Lactic Acid, Venous: 0.9 mmol/L (ref 0.5–2.2)

## 2014-10-10 MED ORDER — ACETAMINOPHEN 500 MG PO TABS
1000.0000 mg | ORAL_TABLET | Freq: Once | ORAL | Status: AC
Start: 2014-10-10 — End: 2014-10-10
  Administered 2014-10-10: 1000 mg via ORAL
  Filled 2014-10-10: qty 2

## 2014-10-10 NOTE — ED Notes (Signed)
Pt here c/o fever and head pressure; pt sts had recent hx of infection in his head that required IV antibiotics

## 2014-10-10 NOTE — Telephone Encounter (Signed)
Called the patient to try and schedule him to be seen Thursday 10/12/14 and he advised he is having lots of pressure in his head and back of neck and he is still having fevers. Hearing this I placed the patient on hold and called Dr Drue SecondSnider and advised her of the symptoms and advised to have the patient to go to the ED to be evaluated and have them page her if necessary for questions. Advised the patient of this and he advised he will go now.

## 2014-10-10 NOTE — ED Provider Notes (Signed)
CSN: [id number]     Arrival date & time 10/10/14  1541 History   First MD Initiated Contact with Patient 10/10/14 1646     Chief Complaint  Patient presents with  . Headache     (Consider location/radiation/quality/duration/timing/severity/associated sxs/prior Treatment) Patient is a 43 y.o. male presenting with general illness.  Illness Quality:  Fever, myalgias, headache Severity:  Moderate Onset quality:  Gradual Duration:  3 days Timing:  Constant Progression:  Worsening Chronicity:  New Context:  Hospitalized 2 months ago for mastoiditis and septic thrombophlebitis.  Completed 6 weeks of IV antibiotics. Started on by mouth doxycycline several days ago. Relieved by:  Nothing Ineffective treatments:  None tried Associated symptoms: diarrhea (Mild. started after starting doxycycline), fever (tmax 101), headaches (there is generalized, pressure-like, and started today.) and myalgias   Associated symptoms: no abdominal pain, no chest pain, no congestion, no nausea and no shortness of breath     Past Medical History  Diagnosis Date  . Depression    History reviewed. No pertinent past surgical history. History reviewed. No pertinent family history. History  Substance Use Topics  . Smoking status: Never Smoker   . Smokeless tobacco: Not on file  . Alcohol Use: 0.0 oz/week    1-2 Cans of beer per week    Review of Systems  Constitutional: Positive for fever (tmax 101).  HENT: Negative for congestion.   Respiratory: Negative for shortness of breath.   Cardiovascular: Negative for chest pain.  Gastrointestinal: Positive for diarrhea (Mild. started after starting doxycycline). Negative for nausea and abdominal pain.  Musculoskeletal: Positive for myalgias.  Neurological: Positive for headaches (there is generalized, pressure-like, and started today.).  All other systems reviewed and are negative.     Allergies  Penicillins; Prednisone; and Vancomycin  Home Medications    Prior to Admission medications   Medication Sig Start Date End Date Taking? Authorizing Provider  diazepam (VALIUM) 10 MG tablet Take 10 mg by mouth every 8 (eight) hours as needed for anxiety (muscle pain). 08/24/14   Donita Brooks, MD  doxycycline (VIBRA-TABS) 100 MG tablet Take 1 tablet (100 mg total) by mouth 2 (two) times daily. 10/05/14   Judyann Munson, MD  metroNIDAZOLE (FLAGYL) 500 MG tablet Take 1 tablet (500 mg total) by mouth every 8 (eight) hours. Patient not taking: Reported on 10/05/2014 08/29/14   Leatha Gilding, MD  ondansetron (ZOFRAN) 4 MG tablet Take 1 tablet (4 mg total) by mouth every 8 (eight) hours as needed for nausea or vomiting. 10/05/14   Judyann Munson, MD  Rivaroxaban (XARELTO STARTER PACK) 15 & 20 MG TBPK Take as directed on package: Start with one 15mg  tablet by mouth twice a day with food. On Day 22, switch to one 20mg  tablet once a day with food. Patient not taking: Reported on 10/05/2014 08/29/14   Leatha Gilding, MD  rivaroxaban (XARELTO) 20 MG TABS tablet Take 1 tablet (20 mg total) by mouth daily with supper. After you finish the starter pack 08/29/14   Costin Otelia Sergeant, MD   BP 115/78 mmHg  Pulse 79  Temp(Src) 99.3 F (37.4 C)  Resp 25  SpO2 99% Physical Exam  Constitutional: He is oriented to person, place, and time. He appears well-developed and well-nourished.  Non-toxic appearance. He does not have a sickly appearance. He does not appear ill. No distress.  HENT:  Head: Normocephalic and atraumatic.  Mouth/Throat: Oropharynx is clear and moist.  Eyes: Conjunctivae are normal. Pupils are equal, round, and  reactive to light. No scleral icterus.  Neck: Neck supple. No muscular tenderness present.  Cardiovascular: Normal rate, regular rhythm, normal heart sounds and intact distal pulses.   No murmur heard. Pulmonary/Chest: Effort normal and breath sounds normal. No stridor. No respiratory distress. He has no wheezes. He has no rales.  Abdominal:  Soft. He exhibits no distension. There is no tenderness.  Musculoskeletal: Normal range of motion. He exhibits no edema.  Neurological: He is alert and oriented to person, place, and time. He has normal strength. No cranial nerve deficit or sensory deficit. Coordination and gait normal. GCS eye subscore is 4. GCS verbal subscore is 5. GCS motor subscore is 6.  Skin: Skin is warm and dry. No rash noted.  Psychiatric: He has a normal mood and affect. His behavior is normal.  Nursing note and vitals reviewed.   ED Course  Procedures (including critical care time) Labs Review Labs Reviewed  CULTURE, BLOOD (ROUTINE X 2)  CULTURE, BLOOD (ROUTINE X 2)  CBC WITH DIFFERENTIAL  COMPREHENSIVE METABOLIC PANEL  I-STAT CG4 LACTIC ACID, ED    Imaging Review Ct Head Wo Contrast  10/10/2014   CLINICAL DATA:  headache,neck pain following dental procedures in early October. He was admitted to the hospital for intractable headache, fever, leukocytosis. He was found to have acute mastoiditis, right transverse sinus septic thrombophlebitis on MRI, likely related to recent dental work. He was discharged from hospital on ceftriaxone, vancomycin, and oral metronidazole for acute mastoiditis, right transverse sinus septic thrombophlebitis, as well as anticoagulation. All blood cx were negative. He was transitioned to ceftaroline and metronidazole due to rash that was thought to be due to vancomycin. He has now completed 6 wks of iv antibiotics and feeling back to baseline. He denies any neck pain, jaw pain or headache. Rash completely resolved. He is looking to get back to work. He also reports that he is continue to take his anticoagulant, xarelto.  EXAM: CT HEAD WITHOUT CONTRAST  TECHNIQUE: Contiguous axial images were obtained from the base of the skull through the vertex without intravenous contrast.  COMPARISON:  08/24/2014  FINDINGS: Improved aeration of right mastoid air cells. There is no evidence of acute  intracranial hemorrhage, brain edema, mass lesion, acute infarction, mass effect, or midline shift. Acute infarct may be inapparent on noncontrast CT. No other intra-axial abnormalities are seen, and the ventricles and sulci are within normal limits in size and symmetry. No abnormal extra-axial fluid collections or masses are identified. No significant calvarial abnormality.  IMPRESSION: 1. Negative for bleed or other acute intracranial process. 2. Improving aeration of right mastoid air cells.   Electronically Signed   By: Oley Balmaniel  Hassell M.D.   On: 10/10/2014 19:43  All radiology studies independently viewed by me.      EKG Interpretation None      MDM   Final diagnoses:  Headache  Fever, unspecified fever cause    43 year old male who is recently finished prolonged IV antibiotics for mastoiditis and septic thrombophlebitis. He presents today with 3 days of fever as high as 101, generalized myalgias, and now headache. He states his headache is different than the headache he had when he initially presented two months ago.  His lab work was normal.  CT of his head showed improvement in no acute abnormalities. He had no meningeal signs, neck stiffness, or any other clinical signs of meningitis.  I discussed his case with Dr. Orvan Falconerampbell, infectious diseases, who recommended continued doxycycline and either observation or discharge  with close follow-up depending on his clinical status. During the remainder of his ED course, he continued to appear well and nontoxic. His myalgias resolved and his headache improved after Tylenol. His vitals remained stable and he remained afebrile. His neuro exam was normal. He appeared stable and appropriate for discharge with close ID follow-up. I discussedhis with the patient, who agreed with the plan. He was given return precautions.    Warnell Foresterrey Melissaann Dizdarevic, MD 10/10/14 [phone]

## 2014-10-10 NOTE — Telephone Encounter (Signed)
Lets see him on thur

## 2014-10-10 NOTE — ED Notes (Signed)
Pt a/o x 4 on d/c with steady gait. 

## 2014-10-10 NOTE — Discharge Instructions (Signed)

## 2014-10-16 LAB — CULTURE, BLOOD (ROUTINE X 2)
CULTURE: NO GROWTH
Culture: NO GROWTH

## 2014-10-20 ENCOUNTER — Encounter: Payer: Self-pay | Admitting: Internal Medicine

## 2014-10-20 ENCOUNTER — Ambulatory Visit (HOSPITAL_COMMUNITY)

## 2014-10-30 DIAGNOSIS — G039 Meningitis, unspecified: Secondary | ICD-10-CM

## 2014-10-30 DIAGNOSIS — Z452 Encounter for adjustment and management of vascular access device: Secondary | ICD-10-CM

## 2014-10-30 DIAGNOSIS — H70091 Acute mastoiditis with other complications, right ear: Secondary | ICD-10-CM

## 2014-10-30 DIAGNOSIS — Z7901 Long term (current) use of anticoagulants: Secondary | ICD-10-CM

## 2014-10-30 DIAGNOSIS — G08 Intracranial and intraspinal phlebitis and thrombophlebitis: Secondary | ICD-10-CM

## 2014-11-10 ENCOUNTER — Ambulatory Visit (HOSPITAL_COMMUNITY)
Admission: RE | Admit: 2014-11-10 | Discharge: 2014-11-10 | Disposition: A | Discharge status: Home / Self Care | Source: Ambulatory Visit | Attending: Internal Medicine | Admitting: Internal Medicine

## 2014-11-10 DIAGNOSIS — R51 Headache: Secondary | ICD-10-CM | POA: Insufficient documentation

## 2014-11-10 DIAGNOSIS — I809 Phlebitis and thrombophlebitis of unspecified site: Secondary | ICD-10-CM

## 2014-11-10 DIAGNOSIS — R509 Fever, unspecified: Secondary | ICD-10-CM | POA: Insufficient documentation

## 2014-11-10 DIAGNOSIS — Z09 Encounter for follow-up examination after completed treatment for conditions other than malignant neoplasm: Secondary | ICD-10-CM | POA: Insufficient documentation

## 2014-11-10 DIAGNOSIS — G08 Intracranial and intraspinal phlebitis and thrombophlebitis: Secondary | ICD-10-CM | POA: Diagnosis present

## 2014-11-10 MED ORDER — GADOBENATE DIMEGLUMINE 529 MG/ML IV SOLN
20.0000 mL | Freq: Once | INTRAVENOUS | Status: AC | PRN
  Administered 2014-11-10: 20 mL via INTRAVENOUS

## 2014-11-14 ENCOUNTER — Encounter: Payer: Self-pay | Admitting: Internal Medicine

## 2014-11-14 ENCOUNTER — Ambulatory Visit (INDEPENDENT_AMBULATORY_CARE_PROVIDER_SITE_OTHER): Admitting: Internal Medicine

## 2014-11-14 VITALS — BP 134/85 | HR 60 | Temp 98.7°F | Wt 216.0 lb

## 2014-11-14 DIAGNOSIS — I809 Phlebitis and thrombophlebitis of unspecified site: Secondary | ICD-10-CM

## 2014-11-14 DIAGNOSIS — G08 Intracranial and intraspinal phlebitis and thrombophlebitis: Secondary | ICD-10-CM

## 2014-11-14 NOTE — Progress Notes (Signed)
   Subjective:    Patient ID: Jack Graves, male    DOB: 01/08/1971, 44 y.o.   MRN: [id number]  HPI  44yo M with septic thrombosis transverse sinus/acute mastoiditis, treated with 6 wk of IV antibiotics and transitioned to doxycycline who underwent repeat brain mri which showed resolution of thrombosis. He is doing well, back to his baseline health. He completed all his medicaitons including anticoagulant.  Recently [place] where he had bout of sinusitis when returning to [place] in cold weather. Now resolved  Allergies  Allergen Reactions  . Penicillins Rash  . Prednisone Rash  . Vancomycin Diarrhea and Rash   No current outpatient prescriptions on file prior to visit.   No current facility-administered medications on file prior to visit.     Review of Systems 10 point ros is negative, sometimes he feels he has decrease range of motion when moving head to the right.     Objective:   Physical Exam BP 134/85 mmHg  Pulse 60  Temp(Src) 98.7 F (37.1 C) (Oral)  Wt 216 lb (97.977 kg) Gen= in NAD, A x O by 3 HEENT = no thrush Neck = no LAD  Mri results:11/10/14 IMPRESSION: Today's examination is normal except for a small amount of fluid in the mastoid air cells on the right. Resolution of previously seen right transverse sinus thrombosis without evidence of any residual abnormal finding.     Assessment & Plan:  transversse sinus thrombosis =No need for further treatment. rtc as needed. Gave copy of mri results

## 2015-02-06 ENCOUNTER — Telehealth: Payer: Self-pay | Admitting: *Deleted

## 2015-02-06 NOTE — Telephone Encounter (Signed)
Received notification that the patient did not keep his appt with  [facility] and will have to be re-referred.

## 2015-06-04 ENCOUNTER — Encounter (HOSPITAL_COMMUNITY): Payer: Self-pay | Admitting: Emergency Medicine

## 2015-06-04 ENCOUNTER — Emergency Department (INDEPENDENT_AMBULATORY_CARE_PROVIDER_SITE_OTHER)
Admission: EM | Admit: 2015-06-04 | Discharge: 2015-06-04 | Disposition: A | Discharge status: Home / Self Care | Source: Home / Self Care

## 2015-06-04 DIAGNOSIS — Y991 Military activity: Secondary | ICD-10-CM

## 2015-06-04 DIAGNOSIS — R21 Rash and other nonspecific skin eruption: Secondary | ICD-10-CM

## 2015-06-04 DIAGNOSIS — E669 Obesity, unspecified: Secondary | ICD-10-CM

## 2015-06-04 MED ORDER — FLUCONAZOLE 150 MG PO TABS: 150.0000 mg | ORAL_TABLET | Freq: Every day | ORAL | Status: AC

## 2015-06-04 MED ORDER — FLUCONAZOLE 150 MG PO TABS: 150.0000 mg | ORAL_TABLET | Freq: Every day | ORAL | Status: DC

## 2015-06-04 NOTE — Discharge Instructions (Signed)
Please get lab work at Dr. Solon [place] office as soon as possible  You may then start taking  3weeks of tablets for what I suspect may be a fungal infection Of course if you do not get any better I would recommend you either see a dermatologist who I can give you the name of or Dr. Briant Cedar follow-up with you as an outpatient

## 2015-06-04 NOTE — ED Provider Notes (Signed)
CSN: [id number]     Arrival date & time 06/04/15  1300 History   First MD Initiated Contact with Patient 06/04/15 1314     Chief Complaint  Patient presents with  . Rash   HPI  44 y/o [place] [employer] stationed in [place] ? h/o HSV, prior Septic Thombosis transv sinus---6 wk IV Abx--> Doxy presents to [facility] with a two-week history of rash. Patient states that the rash started about 2-3 weeks ago and always happens at around this time of year. He wears heavy boots and socks typically hair at work but for the past couple of weeks has not used that and has been wearing sandals Tells me that the rash at the back of the heel and also then spread from the back of the heel approximately 2 the top of the foot He has sought attention at his primary care physician's office but cannot get in there to see him once again He received Diflucan tablets from a friend and took one tablet on 06/01/15 as well as 06/03/15 and also has been putting beclomethasone cream over his hands where the rash eventually spread to. The hands have been [employer] meeting and he also has noticed some punctate red hard lesions in the hand as well as the sole of the foot This is dissimilar to his prior admission for septic thrombus transverse sinusitis He tells me he has not had any exposure to soap dye exotic travel or anyone else who has had similar symptoms He feels that the combination of the prednisone as well as the Diflucan and home remedy of apple cider vinegar and water with which he soaked his feet has helped marginally only He has no fever, no chill, no other rash anywhere else over the body, He tells me that he has no history of autoimmune disorder or family history of the same He also tells me that he wants tablets for this    Past Medical History  Diagnosis Date  . Depression    No past surgical history on file. No family history on file. History  Substance Use Topics  . Smoking status: Never Smoker   .  Smokeless tobacco: Not on file  . Alcohol Use: 0.0 oz/week    1-2 Cans of beer per week    Review of Systems  A 14 point ROS was performed and is negative except as noted in the HPI - Headache, - chest pain, - cough, minus ankle, - dysuria, - sexual contact outside of his 20 years of marriage recently, - 2, - sinus pain, - type diplopia  Allergies  Penicillins; Prednisone; and Vancomycin  Home Medications   Prior to Admission medications   Not on File   There were no vitals taken for this visit. Physical Exam EOMI NCAT, slightly obese S1-S2 no murmur rub or gallop No submandibular lymphadenopathy thyroid is normal Moderate dentition with caries in the back of the mandible and in the top teeth as well as Tonsil seen normal No other lymphadenopathy anywhere else in the groin or supraclavicularly Abdomen is soft nontender nondistended no rebound Patient has a confluent firm area on his left foot which has punctate macular reddish areas that do not blanch He has also a rash on his heel bilaterally His palms appear to have an erythematous punctate papular rash which is firm and does not blanch either  ED Course  Procedures (including critical care time) Labs Review Labs Reviewed - No data to display  Imaging Review  No results found.   MDM   1. Rash -DDX = either autoimmune versus infectious process. This is of course a lot more complicated than a simple rash given the fact that patient has had severe sinusitis leading to thrombosis of the transverse artery. I feel however he is clinically stable and in no extra miss at present time and I feel that if he can get a complete metabolic panel as well as a HIV test and hepatitis panel as an outpatient he can probably get his Diflucan 150 mg for 3 weeks and if no better he can follow up with Dr. Gailen Shelter versus Dr. Ilsa Iha.  Warning signs were mentioned to the patient and it was encouraged for him to follow-up soon with his primary care  physician   2. Obesity (BMI 35.0-39.9 without comorbidity)   3. Military activity status    Pleas Koch, MD [facility] [phone]     Rhetta Mura, MD 06/04/15 1346

## 2015-06-04 NOTE — ED Notes (Addendum)
Pt has a dry, peeling rash on the palms of his hands and feet that started about 3 weeks ago.  Pt states his feet are mildly painful when he is ambulating.  Pt has tried oral and topical remedies at home with no relief.

## 2015-06-11 ENCOUNTER — Telehealth: Payer: Self-pay | Admitting: Family Medicine

## 2015-06-11 DIAGNOSIS — R21 Rash and other nonspecific skin eruption: Secondary | ICD-10-CM

## 2015-06-11 NOTE — Telephone Encounter (Addendum)
Patient is calling to say that he went to [facility] for a rxn that affected his skin, would like to know if we can know refer him to [facility] dermatology dr Fayrene Fearing williams   [phone] (H)

## 2015-06-11 NOTE — Telephone Encounter (Signed)
Referral ordered

## 2015-06-11 NOTE — Telephone Encounter (Signed)
Ok with referral. 

## 2015-06-11 NOTE — Telephone Encounter (Signed)
Ok to do referral?  

## 2015-06-14 ENCOUNTER — Telehealth: Payer: Self-pay | Admitting: *Deleted

## 2015-06-14 NOTE — Telephone Encounter (Signed)
Submitted referral to [employer] for authorization to [facility] has been approved with reference number [id number]  Provider: Corliss Parish  Dx: [id number] and other nonspecific skin eruption  Service codes: [phone]   Service dates: 06/12/15- 09/10/15  Number of visits:1  Service codes: [phone]  Services dates: 06/12/15-12/09/15  Visits:5

## 2015-06-20 LAB — VIRAL CULTURE VIRC: Culture: UNDETERMINED

## 2015-07-15 IMAGING — CT CT HEAD W/O CM
1 of 2 series · 15 of 30 positions shown, 19 images · non-contrast
Comparison: 08/24/2014

CLINICAL DATA: headache,neck pain following dental procedures in
early [REDACTED]. He was admitted to the hospital for intractable
headache, fever, leukocytosis. He was found to have acute
mastoiditis, right transverse sinus septic thrombophlebitis on MRI,
likely related to recent dental work. He was discharged from
hospital on ceftriaxone, vancomycin, and oral metronidazole for
acute mastoiditis, right transverse sinus septic thrombophlebitis,
as well as anticoagulation. All blood Ghel were negative. He was
transitioned to ceftaroline and metronidazole due to rash that was
thought to be due to vancomycin. He has now completed 6 wks of iv
antibiotics and feeling back to baseline. He denies any neck pain,
jaw pain or headache. Rash completely resolved. He is looking to get
back to work. He also reports that he is continue to take his
anticoagulant, xarelto.

EXAM:
CT HEAD WITHOUT CONTRAST
TECHNIQUE: Contiguous axial images were obtained from the base of the skull
through the vertex without intravenous contrast.

[Series 3: head 2.0 h70h · axial · 0.43mm/px · z∈[-335,-191]mm · 15 of 80 slices shown, 19 images]
[im 4/80  brain]
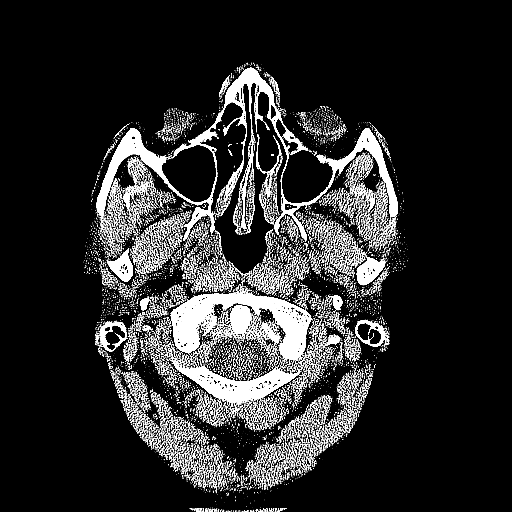
[im 4/80  bone]
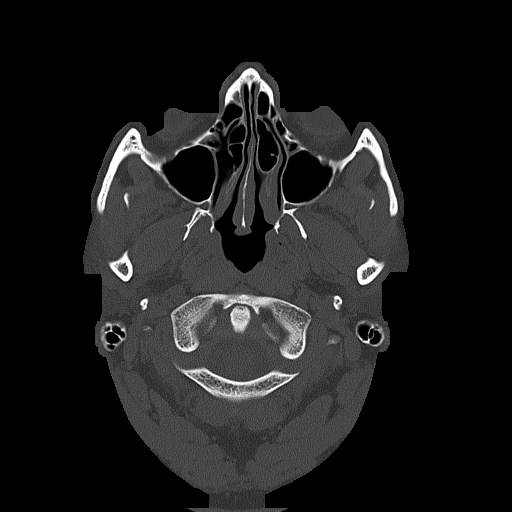
[im 8/80  brain]
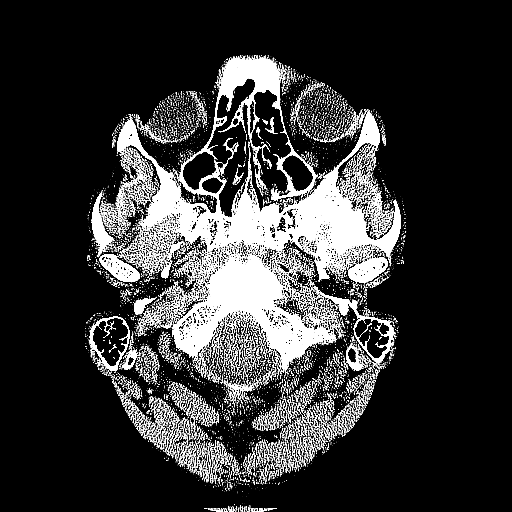
[im 16/80  brain]
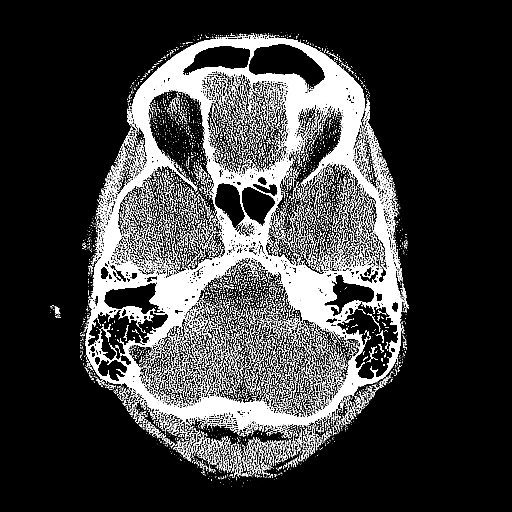
[im 20/80  brain]
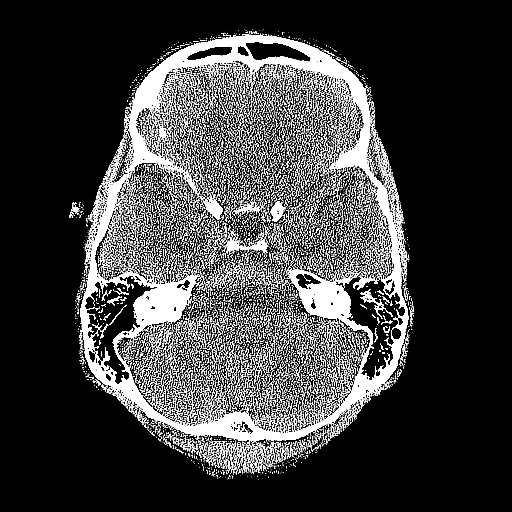
[im 24/80  brain]
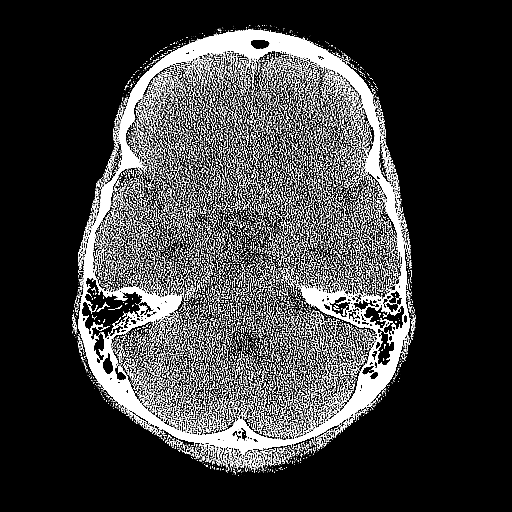
[im 24/80  bone]
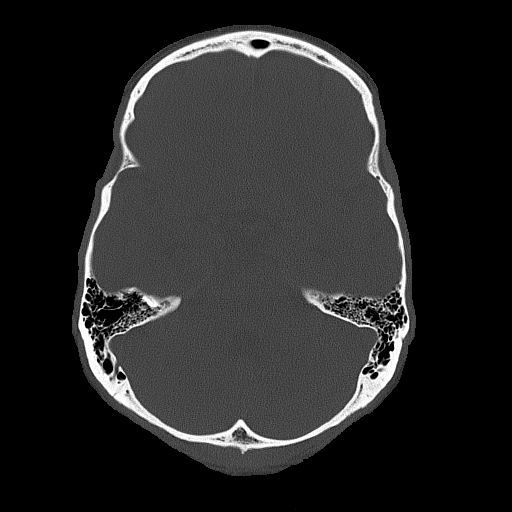
[im 28/80  brain]
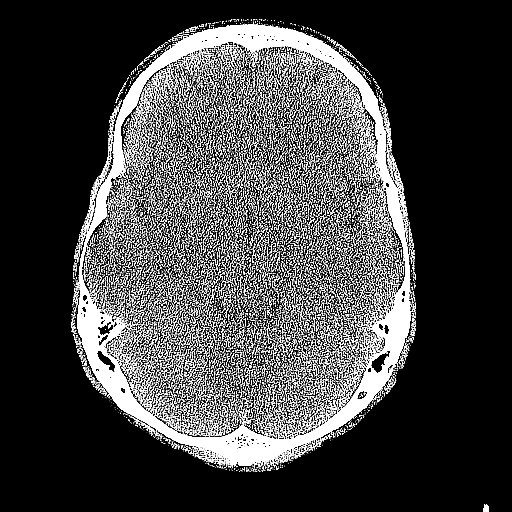
[im 36/80  brain]
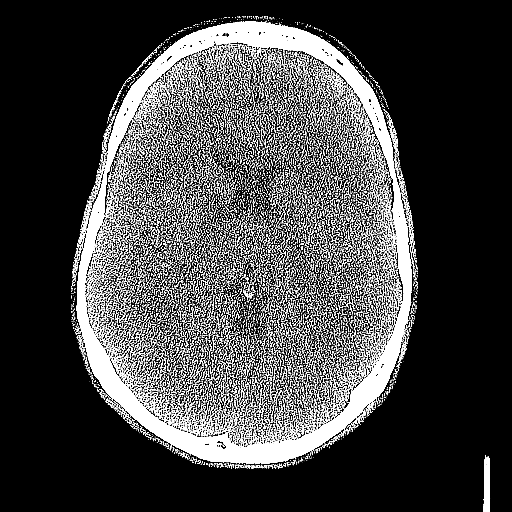
[im 40/80  brain]
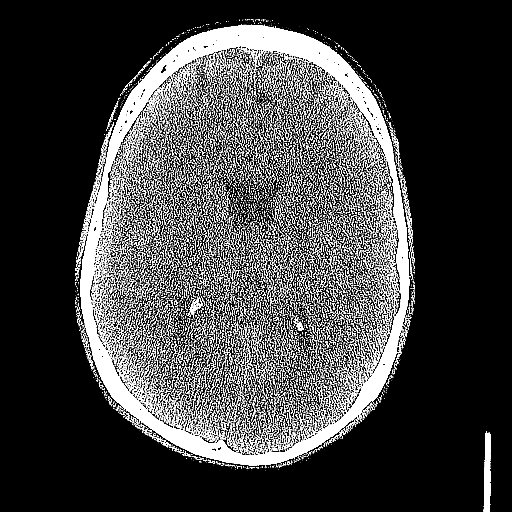
[im 44/80  brain]
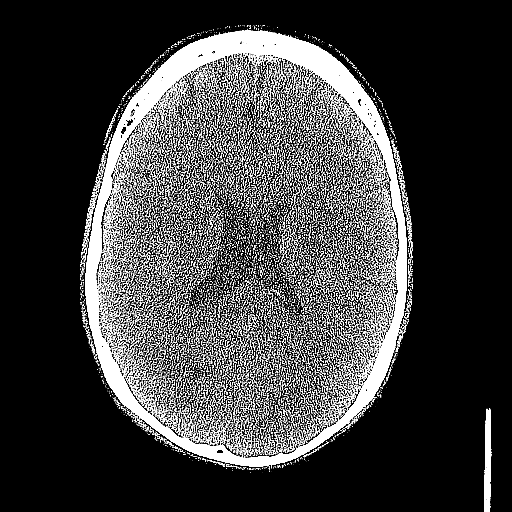
[im 44/80  bone]
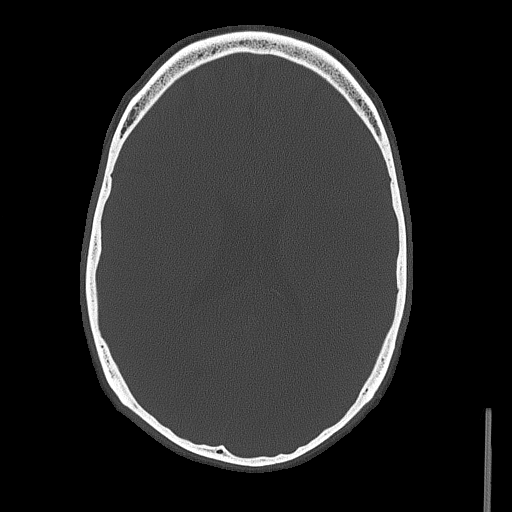
[im 52/80  brain]
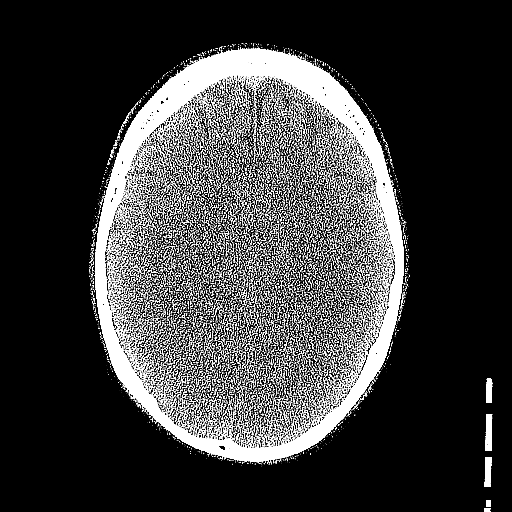
[im 56/80  brain]
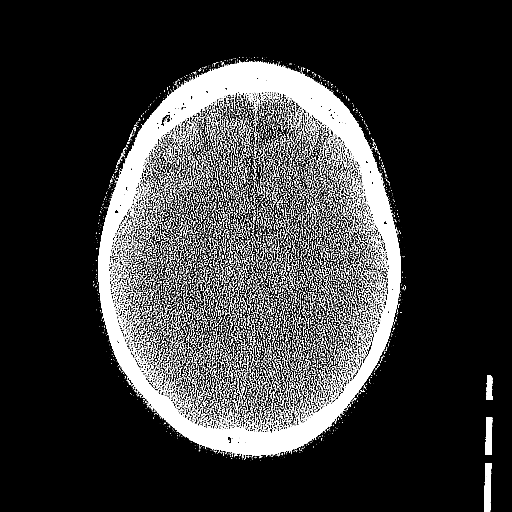
[im 60/80  brain]
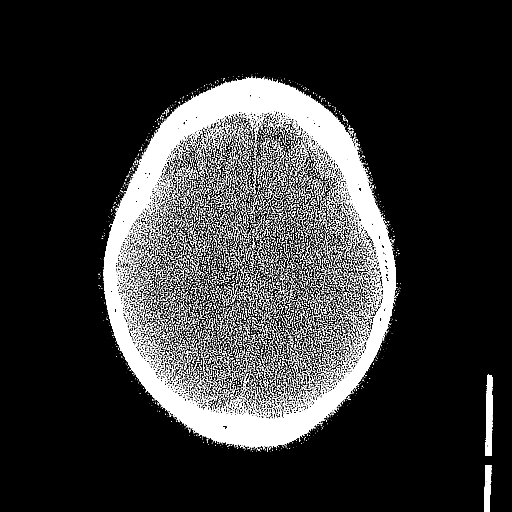
[im 64/80  brain]
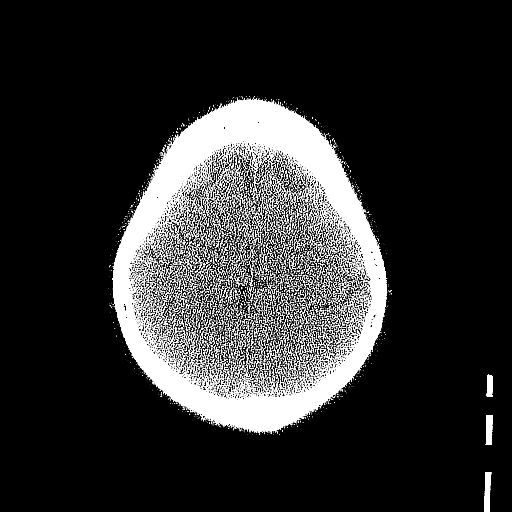
[im 64/80  bone]
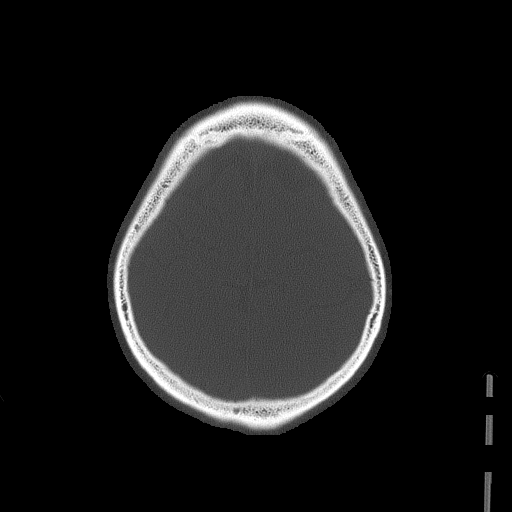
[im 72/80  brain]
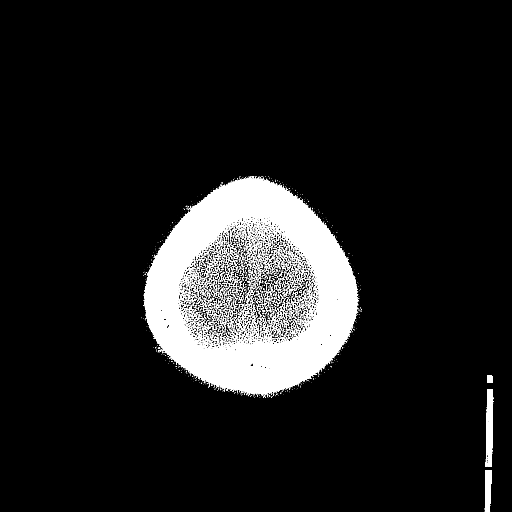
[im 76/80  brain]
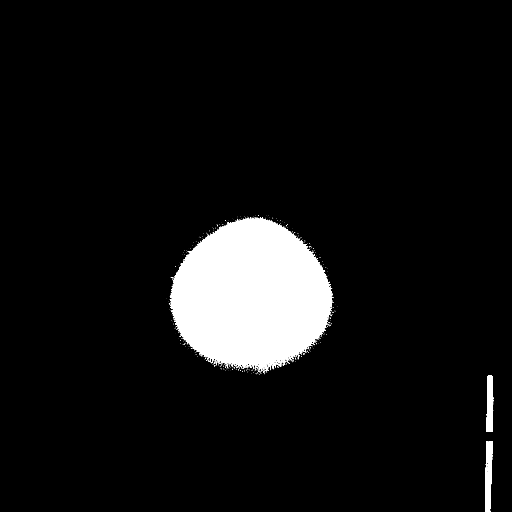

[15 of 30 positions shown; findings below may reference images not displayed]

FINDINGS: Improved aeration of right mastoid air cells. There is no evidence
of acute intracranial hemorrhage, brain edema, mass lesion, acute
infarction, mass effect, or midline shift. Acute infarct may be
inapparent on noncontrast CT. No other intra-axial abnormalities are
seen, and the ventricles and sulci are within normal limits in size
and symmetry. No abnormal extra-axial fluid collections or masses
are identified. No significant calvarial abnormality.
IMPRESSION: 1. Negative for bleed or other acute intracranial process.
2. Improving aeration of right mastoid air cells.
